# Patient Record
Sex: Female | Born: 1940 | Race: White | Hispanic: No | Marital: Married | Smoking: Never smoker
Health system: Southern US, Community
[De-identification: ages and names within clinical notes are randomized; demographics above are authoritative.]

## PROBLEM LIST (undated history)

## (undated) DIAGNOSIS — J45909 Unspecified asthma, uncomplicated: Secondary | ICD-10-CM

## (undated) DIAGNOSIS — Z889 Allergy status to unspecified drugs, medicaments and biological substances status: Secondary | ICD-10-CM

## (undated) DIAGNOSIS — C801 Malignant (primary) neoplasm, unspecified: Secondary | ICD-10-CM

## (undated) DIAGNOSIS — M1990 Unspecified osteoarthritis, unspecified site: Secondary | ICD-10-CM

## (undated) DIAGNOSIS — Z923 Personal history of irradiation: Secondary | ICD-10-CM

## (undated) DIAGNOSIS — K219 Gastro-esophageal reflux disease without esophagitis: Secondary | ICD-10-CM

## (undated) DIAGNOSIS — K2970 Gastritis, unspecified, without bleeding: Secondary | ICD-10-CM

## (undated) DIAGNOSIS — I499 Cardiac arrhythmia, unspecified: Secondary | ICD-10-CM

## (undated) DIAGNOSIS — E785 Hyperlipidemia, unspecified: Secondary | ICD-10-CM

## (undated) HISTORY — PX: EYE SURGERY: SHX253

## (undated) HISTORY — PX: NISSEN FUNDOPLICATION: SHX2091

## (undated) HISTORY — PX: HAND ARTHROPLASTY: SHX968

## (undated) HISTORY — PX: ABDOMINAL HYSTERECTOMY: SHX81

## (undated) HISTORY — PX: DIAGNOSTIC LAPAROSCOPY: SUR761

## (undated) HISTORY — DX: Hyperlipidemia, unspecified: E78.5

## (undated) HISTORY — PX: TONSILLECTOMY: SUR1361

## (undated) HISTORY — PX: KNEE ARTHROSCOPY: SHX127

## (undated) HISTORY — PX: CHOLECYSTECTOMY: SHX55

## (undated) HISTORY — PX: DG TOES RT FOOT MULTIPLE SPECIF (ARMC HX): HXRAD1007

---

## 2014-06-01 ENCOUNTER — Other Ambulatory Visit: Payer: Self-pay | Admitting: Orthopedic Surgery

## 2014-07-07 ENCOUNTER — Encounter (HOSPITAL_BASED_OUTPATIENT_CLINIC_OR_DEPARTMENT_OTHER): Payer: Self-pay | Admitting: *Deleted

## 2014-07-09 ENCOUNTER — Encounter (HOSPITAL_BASED_OUTPATIENT_CLINIC_OR_DEPARTMENT_OTHER): Payer: Self-pay | Admitting: Orthopedic Surgery

## 2014-07-09 ENCOUNTER — Ambulatory Visit (HOSPITAL_BASED_OUTPATIENT_CLINIC_OR_DEPARTMENT_OTHER)
Admission: RE | Admit: 2014-07-09 | Discharge: 2014-07-09 | Disposition: A | Discharge status: Home / Self Care | Payer: Medicare Other | Source: Ambulatory Visit | Attending: Orthopedic Surgery | Admitting: Orthopedic Surgery

## 2014-07-09 ENCOUNTER — Ambulatory Visit (HOSPITAL_BASED_OUTPATIENT_CLINIC_OR_DEPARTMENT_OTHER): Payer: Medicare Other | Admitting: Certified Registered"

## 2014-07-09 ENCOUNTER — Encounter (HOSPITAL_BASED_OUTPATIENT_CLINIC_OR_DEPARTMENT_OTHER)
Admission: RE | Disposition: A | Discharge status: Home / Self Care | Payer: Self-pay | Source: Ambulatory Visit | Attending: Orthopedic Surgery

## 2014-07-09 DIAGNOSIS — G5602 Carpal tunnel syndrome, left upper limb: Secondary | ICD-10-CM | POA: Insufficient documentation

## 2014-07-09 DIAGNOSIS — J45909 Unspecified asthma, uncomplicated: Secondary | ICD-10-CM | POA: Diagnosis not present

## 2014-07-09 DIAGNOSIS — M158 Other polyosteoarthritis: Secondary | ICD-10-CM | POA: Insufficient documentation

## 2014-07-09 DIAGNOSIS — K219 Gastro-esophageal reflux disease without esophagitis: Secondary | ICD-10-CM | POA: Diagnosis not present

## 2014-07-09 DIAGNOSIS — R200 Anesthesia of skin: Secondary | ICD-10-CM | POA: Diagnosis present

## 2014-07-09 DIAGNOSIS — Z79899 Other long term (current) drug therapy: Secondary | ICD-10-CM | POA: Insufficient documentation

## 2014-07-09 DIAGNOSIS — Z96691 Finger-joint replacement of right hand: Secondary | ICD-10-CM | POA: Diagnosis not present

## 2014-07-09 DIAGNOSIS — Z791 Long term (current) use of non-steroidal anti-inflammatories (NSAID): Secondary | ICD-10-CM | POA: Diagnosis not present

## 2014-07-09 DIAGNOSIS — M47892 Other spondylosis, cervical region: Secondary | ICD-10-CM | POA: Insufficient documentation

## 2014-07-09 HISTORY — DX: Unspecified asthma, uncomplicated: J45.909

## 2014-07-09 HISTORY — DX: Allergy status to unspecified drugs, medicaments and biological substances: Z88.9

## 2014-07-09 HISTORY — DX: Gastro-esophageal reflux disease without esophagitis: K21.9

## 2014-07-09 HISTORY — PX: CARPAL TUNNEL RELEASE: SHX101

## 2014-07-09 HISTORY — DX: Unspecified osteoarthritis, unspecified site: M19.90

## 2014-07-09 HISTORY — DX: Gastritis, unspecified, without bleeding: K29.70

## 2014-07-09 LAB — POCT HEMOGLOBIN-HEMACUE: HEMOGLOBIN: 13.5 g/dL (ref 12.0–15.0)

## 2014-07-09 SURGERY — CARPAL TUNNEL RELEASE
Anesthesia: Monitor Anesthesia Care | Site: Hand | Laterality: Left

## 2014-07-09 MED ORDER — ONDANSETRON HCL 4 MG/2ML IJ SOLN
INTRAMUSCULAR | Status: DC | PRN
  Administered 2014-07-09: 4 mg via INTRAVENOUS

## 2014-07-09 MED ORDER — LACTATED RINGERS IV SOLN
INTRAVENOUS | Status: DC
  Administered 2014-07-09: 08:00:00 via INTRAVENOUS

## 2014-07-09 MED ORDER — PROPOFOL 10 MG/ML IV BOLUS
INTRAVENOUS | Status: DC | PRN
  Administered 2014-07-09 (×3): 20 mg via INTRAVENOUS

## 2014-07-09 MED ORDER — GLYCOPYRROLATE 0.2 MG/ML IJ SOLN: 0.2000 mg | Freq: Once | INTRAMUSCULAR | Status: DC | PRN

## 2014-07-09 MED ORDER — LIDOCAINE HCL (PF) 0.5 % IJ SOLN
INTRAMUSCULAR | Status: DC | PRN
  Administered 2014-07-09: 25 mL via INTRAVENOUS

## 2014-07-09 MED ORDER — LIDOCAINE HCL (CARDIAC) 20 MG/ML IV SOLN
INTRAVENOUS | Status: DC | PRN
  Administered 2014-07-09: 10 mg via INTRAVENOUS

## 2014-07-09 MED ORDER — VANCOMYCIN HCL IN DEXTROSE 1-5 GM/200ML-% IV SOLN
INTRAVENOUS | Status: AC
  Filled 2014-07-09: qty 200

## 2014-07-09 MED ORDER — BUPIVACAINE HCL (PF) 0.25 % IJ SOLN
INTRAMUSCULAR | Status: DC | PRN
  Administered 2014-07-09: 4 mL

## 2014-07-09 MED ORDER — MIDAZOLAM HCL 2 MG/2ML IJ SOLN
INTRAMUSCULAR | Status: AC
  Filled 2014-07-09: qty 2

## 2014-07-09 MED ORDER — FENTANYL CITRATE (PF) 100 MCG/2ML IJ SOLN: 25.0000 ug | INTRAMUSCULAR | Status: DC | PRN

## 2014-07-09 MED ORDER — VANCOMYCIN HCL IN DEXTROSE 1-5 GM/200ML-% IV SOLN
1000.0000 mg | INTRAVENOUS | Status: AC
  Administered 2014-07-09: 1000 mg via INTRAVENOUS

## 2014-07-09 MED ORDER — FENTANYL CITRATE (PF) 100 MCG/2ML IJ SOLN: 50.0000 ug | INTRAMUSCULAR | Status: DC | PRN

## 2014-07-09 MED ORDER — MEPERIDINE HCL 25 MG/ML IJ SOLN: 6.2500 mg | INTRAMUSCULAR | Status: DC | PRN

## 2014-07-09 MED ORDER — FENTANYL CITRATE (PF) 100 MCG/2ML IJ SOLN
INTRAMUSCULAR | Status: DC | PRN
  Administered 2014-07-09: 25 ug via INTRAVENOUS
  Administered 2014-07-09: 50 ug via INTRAVENOUS

## 2014-07-09 MED ORDER — BUPIVACAINE HCL (PF) 0.25 % IJ SOLN
INTRAMUSCULAR | Status: AC
  Filled 2014-07-09: qty 30

## 2014-07-09 MED ORDER — CHLORHEXIDINE GLUCONATE 4 % EX LIQD: 60.0000 mL | Freq: Once | CUTANEOUS | Status: DC

## 2014-07-09 MED ORDER — FENTANYL CITRATE (PF) 100 MCG/2ML IJ SOLN
INTRAMUSCULAR | Status: AC
  Filled 2014-07-09: qty 4

## 2014-07-09 MED ORDER — MIDAZOLAM HCL 2 MG/2ML IJ SOLN
1.0000 mg | INTRAMUSCULAR | Status: DC | PRN
Start: 2014-07-09 — End: 2014-07-09
  Administered 2014-07-09: 1 mg via INTRAVENOUS

## 2014-07-09 MED ORDER — HYDROCODONE-ACETAMINOPHEN 5-325 MG PO TABS: 1.0000 | ORAL_TABLET | Freq: Four times a day (QID) | ORAL | Status: DC | PRN

## 2014-07-09 SURGICAL SUPPLY — 37 items
BLADE SURG 15 STRL LF DISP TIS (BLADE) ×1 IMPLANT
BLADE SURG 15 STRL SS (BLADE) ×1
BNDG COHESIVE 3X5 TAN STRL LF (GAUZE/BANDAGES/DRESSINGS) ×2 IMPLANT
BNDG ESMARK 4X9 LF (GAUZE/BANDAGES/DRESSINGS) IMPLANT
BNDG GAUZE ELAST 4 BULKY (GAUZE/BANDAGES/DRESSINGS) ×2 IMPLANT
CHLORAPREP W/TINT 26ML (MISCELLANEOUS) ×2 IMPLANT
CORDS BIPOLAR (ELECTRODE) ×2 IMPLANT
COVER BACK TABLE 60X90IN (DRAPES) ×2 IMPLANT
COVER MAYO STAND STRL (DRAPES) ×2 IMPLANT
CUFF TOURNIQUET SINGLE 18IN (TOURNIQUET CUFF) ×2 IMPLANT
DRAPE EXTREMITY T 121X128X90 (DRAPE) ×2 IMPLANT
DRAPE SURG 17X23 STRL (DRAPES) ×2 IMPLANT
DRSG PAD ABDOMINAL 8X10 ST (GAUZE/BANDAGES/DRESSINGS) ×2 IMPLANT
GAUZE SPONGE 4X4 12PLY STRL (GAUZE/BANDAGES/DRESSINGS) ×2 IMPLANT
GAUZE XEROFORM 1X8 LF (GAUZE/BANDAGES/DRESSINGS) ×2 IMPLANT
GLOVE BIO SURGEON STRL SZ 6.5 (GLOVE) ×2 IMPLANT
GLOVE BIOGEL PI IND STRL 7.0 (GLOVE) ×1 IMPLANT
GLOVE BIOGEL PI IND STRL 7.5 (GLOVE) ×1 IMPLANT
GLOVE BIOGEL PI IND STRL 8.5 (GLOVE) ×1 IMPLANT
GLOVE BIOGEL PI INDICATOR 7.0 (GLOVE) ×1
GLOVE BIOGEL PI INDICATOR 7.5 (GLOVE) ×1
GLOVE BIOGEL PI INDICATOR 8.5 (GLOVE) ×1
GLOVE SURG ORTHO 8.0 STRL STRW (GLOVE) ×2 IMPLANT
GLOVE SURG SS PI 7.5 STRL IVOR (GLOVE) ×2 IMPLANT
GOWN STRL REUS W/ TWL LRG LVL3 (GOWN DISPOSABLE) ×1 IMPLANT
GOWN STRL REUS W/TWL LRG LVL3 (GOWN DISPOSABLE) ×1
GOWN STRL REUS W/TWL XL LVL3 (GOWN DISPOSABLE) ×4 IMPLANT
NEEDLE PRECISIONGLIDE 27X1.5 (NEEDLE) IMPLANT
NS IRRIG 1000ML POUR BTL (IV SOLUTION) ×2 IMPLANT
PACK BASIN DAY SURGERY FS (CUSTOM PROCEDURE TRAY) ×2 IMPLANT
STOCKINETTE 4X48 STRL (DRAPES) ×2 IMPLANT
SUT ETHILON 4 0 PS 2 18 (SUTURE) ×2 IMPLANT
SUT VICRYL 4-0 PS2 18IN ABS (SUTURE) IMPLANT
SYR BULB 3OZ (MISCELLANEOUS) ×2 IMPLANT
SYR CONTROL 10ML LL (SYRINGE) IMPLANT
TOWEL OR 17X24 6PK STRL BLUE (TOWEL DISPOSABLE) ×2 IMPLANT
UNDERPAD 30X30 (UNDERPADS AND DIAPERS) ×2 IMPLANT

## 2014-07-09 NOTE — Discharge Instructions (Addendum)
[facility] Instructions °Hand Surgery ° °Wound Care: °Keep your hand elevated above the level of your heart.  Do not allow it to dangle by your side.  Keep the dressing dry and do not remove it unless your doctor advises you to do so.  He will usually change it at the time of your post-op visit.  Moving your fingers is advised to stimulate circulation but will depend on the site of your surgery.  If you have a splint applied, your doctor will advise you regarding movement. ° °Activity: °Do not drive or operate machinery today.  Rest today and then you may return to your normal activity and work as indicated by your physician. ° °Diet:  °Drink liquids today or eat a light diet.  You may resume a regular diet tomorrow.   ° °General expectations: °Pain for two to three days. °Fingers may become slightly swollen. ° °Call your doctor if any of the following occur: °Severe pain not relieved by pain medication. °Elevated temperature. °Dressing soaked with blood. °Inability to move fingers. °White or bluish color to fingers. ° ° °Post Anesthesia Home Care Instructions ° °Activity: °Get plenty of rest for the remainder of the day. A responsible adult should stay with you for 24 hours following the procedure.  °For the next 24 hours, DO NOT: °-Drive a car °-Operate machinery °-Drink alcoholic beverages °-Take any medication unless instructed by your physician °-Make any legal decisions or sign important papers. ° °Meals: °Start with liquid foods such as gelatin or soup. Progress to regular foods as tolerated. Avoid greasy, spicy, heavy foods. If nausea and/or vomiting occur, drink only clear liquids until the nausea and/or vomiting subsides. Call your physician if vomiting continues. ° °Special Instructions/Symptoms: °Your throat may feel dry or sore from the anesthesia or the breathing tube placed in your throat during surgery. If this causes discomfort, gargle with warm salt water. The discomfort should disappear within 24  hours. ° °If you had a scopolamine patch placed behind your ear for the management of post- operative nausea and/or vomiting: ° °1. The medication in the patch is effective for 72 hours, after which it should be removed.  Wrap patch in a tissue and discard in the trash. Wash hands thoroughly with soap and water. °2. You may remove the patch earlier than 72 hours if you experience unpleasant side effects which may include dry mouth, dizziness or visual disturbances. °3. Avoid touching the patch. Wash your hands with soap and water after contact with the patch. °  °Call your surgeon if you experience:  ° °1.  Fever over 101.0. °2.  Inability to urinate. °3.  Nausea and/or vomiting. °4.  Extreme swelling or bruising at the surgical site. °5.  Continued bleeding from the incision. °6.  Increased pain, redness or drainage from the incision. °7.  Problems related to your pain medication. °8. Any change in color, movement and/or sensation °9. Any problems and/or concerns ° ° °

## 2014-07-09 NOTE — H&P (Signed)
Rebekah Wells is a 74 year old right hand dominant female. She is Dr. Gavin Pound' mother. She is complaining of her left thumb being numb for the past 6 months. She has had a trigger finger injection to the left thumb back in April. She is visiting from [place], [place]. She does have a history of whiplash injury. She has no history of injury to her hand. She complains of relatively constant numbness and tingling, moderate throbbing pain. She states it is gradually getting worse. She has been wearing a brace. She is occasionally awakened at night. She has a history of arthritis. There is no history of diabetes, thyroid problems, or gout. She has a history of stomach ulcer. She is presently taking Meloxicam. She has had nerve conductions done in October revealing a carpal tunnel syndrome on her left side. I had these to review but unfortunately I am dictating this later and they have taken the nerve conductions with them. The right side was not tested. She has a prior history of joint replacement at the PIP joints right hand.  PAST MEDICAL HISTORY: She is allergic to PCN and sulfa. She is on Plaquenil, Simvastatin, Mobic, Prilosec, Fosamax, and vitamins. She relates no other surgery.  FAMILY MEDICAL HISTORY: Positive for heart disease, high BP and arthritis.  SOCIAL HISTORY:  She does not smoke or use alcohol. She is married and retired.  REVIEW OF SYSTEMS: Positive for cataracts, asthma, stomach ulcer, and anemia, otherwise negative 14 points.  Rebekah Wells is an 74 y.o. female.   Chief Complaint: CTS left HPI:  See above  Past Medical History  Diagnosis Date  . Asthma   . GERD (gastroesophageal reflux disease)   . H/O seasonal allergies   . Gastritis   . Arthritis     OA    Past Surgical History  Procedure Laterality Date  . Diagnostic laparoscopy    . Cholecystectomy    . Tonsillectomy    . Abdominal hysterectomy    . Nissen fundoplication    . Knee arthroscopy      x3  . Hand  arthroplasty Right     for OA  . Dg toes rt foot multiple specif ([facility] hx) Right     History reviewed. No pertinent family history. Social History:  reports that she has never smoked. She does not have any smokeless tobacco history on file. She reports that she does not drink alcohol or use illicit drugs.  Allergies:  Allergies  Allergen Reactions  . Penicillins Rash  . Sulfa Antibiotics Rash    No prescriptions prior to admission    No results found for this or any previous visit (from the past 48 hour(s)).  No results found.   Pertinent items are noted in HPI.  Height 5\' 3"  (1.6 m), weight 63.504 kg (140 lb).  General appearance: alert, cooperative and appears stated age Head: Normocephalic, without obvious abnormality Neck: no adenopathy, no carotid bruit, no JVD, supple, symmetrical, trachea midline and thyroid not enlarged, symmetric, no tenderness/mass/nodules Resp: clear to auscultation bilaterally Cardio: regular rate and rhythm, S1, S2 normal, no murmur, click, rub or gallop GI: soft, non-tender; bowel sounds normal; no masses,  no organomegaly Extremities: numbness left hand Pulses: 2+ and symmetric Skin: Skin color, texture, turgor normal. 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 It is noted she does have angulatory deformities of the PIP joints of the index and middle on her left hand.  DIAGNOSIS: (1) Generalized osteoarthritis with cervical spondylosis. (2) Carpal tunnel syndrome left hand.  PLAN: She would like to proceed to have the left carpal tunnel release performed. Pre, peri and post op care are discussed along with risks and complications. Patient is aware there is no  guarantee with surgery, possibility of infection, injury to arteries, nerves, and tendons, incomplete relief and dystrophy. Questions are encouraged and answered to the patient's satisfaction.   Dilyn Smiles R 07/09/2014, 6:36 AM

## 2014-07-09 NOTE — Anesthesia Postprocedure Evaluation (Signed)
  Anesthesia Post-op Note  Patient: Rebekah Wells  Procedure(s) Performed: Procedure(s): LEFT CARPAL TUNNEL RELEASE (Left)  Patient Location: PACU  Anesthesia Type: MAC, Bier Block   Level of Consciousness: awake, alert  and oriented  Airway and Oxygen Therapy: Patient Spontanous Breathing  Post-op Pain: none  Post-op Assessment: Post-op Vital signs reviewed  Post-op Vital Signs: Reviewed  Last Vitals:  Filed Vitals:   07/09/14 1015  BP: 133/62  Pulse: 82  Temp: 36.8 C  Resp: 16    Complications: No apparent anesthesia complications

## 2014-07-09 NOTE — Anesthesia Procedure Notes (Signed)
Procedure Name: MAC Date/Time: 07/09/2014 8:48 AM Performed by: Baxter Flattery Pre-anesthesia Checklist: Patient identified, Emergency Drugs available, Suction available, Patient being monitored and Timeout performed Patient Re-evaluated:Patient Re-evaluated prior to inductionOxygen Delivery Method: Simple face mask Preoxygenation: Pre-oxygenation with 100% oxygen Intubation Type: IV induction

## 2014-07-09 NOTE — Brief Op Note (Signed)
07/09/2014  9:23 AM  PATIENT:  Rebekah Wells  74 y.o. female  PRE-OPERATIVE DIAGNOSIS:  left carpal tunnel syndrome G56.02  POST-OPERATIVE DIAGNOSIS:  left carpal tunnel syndrome G56.02  PROCEDURE:  Procedure(s): LEFT CARPAL TUNNEL RELEASE (Left)  SURGEON:  Surgeon(s) and Role:    * Daryll Brod, MD - Primary  PHYSICIAN ASSISTANT:   ASSISTANTS: none   ANESTHESIA:   local and regional  EBL:  Total I/O In: 500 [I.V.:500] Out: -   BLOOD ADMINISTERED:none  DRAINS: none   LOCAL MEDICATIONS USED:  BUPIVICAINE   SPECIMEN:  No Specimen  DISPOSITION OF SPECIMEN:  N/A  COUNTS:  YES  TOURNIQUET:   Total Tourniquet Time Documented: Forearm (Left) - 20 minutes Total: Forearm (Left) - 20 minutes   DICTATION: .Other Dictation: Dictation Number [id number]  PLAN OF CARE: Discharge to home after PACU  PATIENT DISPOSITION:  PACU - hemodynamically stable.

## 2014-07-09 NOTE — Anesthesia Preprocedure Evaluation (Signed)
Anesthesia Evaluation  Patient identified by MRN, date of birth, ID band Patient awake    Reviewed: Allergy & Precautions, NPO status , Patient's Chart, lab work & pertinent test results  Airway Mallampati: I  TM Distance: >3 FB Neck ROM: Full    Dental  (+) Upper Dentures, Lower Dentures, Dental Advisory Given   Pulmonary asthma ,  breath sounds clear to auscultation        Cardiovascular Rhythm:Regular Rate:Normal     Neuro/Psych    GI/Hepatic GERD-  Medicated and Controlled,  Endo/Other    Renal/GU      Musculoskeletal   Abdominal   Peds  Hematology   Anesthesia Other Findings   Reproductive/Obstetrics                             Anesthesia Physical Anesthesia Plan  ASA: II  Anesthesia Plan: MAC and Bier Block   Post-op Pain Management:    Induction: Intravenous  Airway Management Planned: Simple Face Mask  Additional Equipment:   Intra-op Plan:   Post-operative Plan: Extubation in OR  Informed Consent: I have reviewed the patients History and Physical, chart, labs and discussed the procedure including the risks, benefits and alternatives for the proposed anesthesia with the patient or authorized representative who has indicated his/her understanding and acceptance.   Dental advisory given  Plan Discussed with: CRNA, Anesthesiologist and Surgeon  Anesthesia Plan Comments:         Anesthesia Quick Evaluation

## 2014-07-09 NOTE — Transfer of Care (Signed)
Immediate Anesthesia Transfer of Care Note  Patient: Rebekah Wells  Procedure(s) Performed: Procedure(s): LEFT CARPAL TUNNEL RELEASE (Left)  Patient Location: PACU  Anesthesia Type:MAC and Bier block  Level of Consciousness: awake, alert  and oriented  Airway & Oxygen Therapy: Patient Spontanous Breathing and Patient connected to face mask oxygen  Post-op Assessment: Report given to RN, Post -op Vital signs reviewed and stable and Patient moving all extremities  Post vital signs: Reviewed and stable  Last Vitals:  Filed Vitals:   07/09/14 0747  BP: 126/75  Pulse: 90  Temp: 36.7 C  Resp: 18    Complications: No apparent anesthesia complications

## 2014-07-09 NOTE — Op Note (Signed)
Dictation Number [id number]

## 2014-07-10 ENCOUNTER — Encounter (HOSPITAL_BASED_OUTPATIENT_CLINIC_OR_DEPARTMENT_OTHER): Payer: Self-pay | Admitting: Orthopedic Surgery

## 2014-07-10 NOTE — Op Note (Signed)
NAMEBRODI, NERY                 ACCOUNT NO.:  [id number]  MEDICAL RECORD NO.:  [id number]  LOCATION:                                FACILITY:  [facility]  PHYSICIAN:  Daryll Brod, M.D.       DATE OF BIRTH:  09-04-1940  DATE OF PROCEDURE:  07/09/2014 DATE OF DISCHARGE:  07/09/2014                              OPERATIVE REPORT   PREOPERATIVE DIAGNOSIS:  Carpal tunnel syndrome, left hand.  POSTOPERATIVE DIAGNOSIS:  Carpal tunnel syndrome, left hand.  OPERATION:  Decompression left median nerve.  SURGEON:  Daryll Brod, MD  ASSISTANT:  None.  ANESTHESIA:  Forearm-based IV regional with local infiltration.  ANESTHESIOLOGIST:  Lorrene Reid, MD  HISTORY:  The patient is a 74 year old female with a history of arthritis, carpal tunnel syndrome.  Nerve conduction is positive.  This has not responded to conservative treatment, and she has elected to undergo surgical release of the median nerve of her left wrist.  Pre, peri, and postoperative course have been discussed along with risks and complications.  She is aware that there is no guarantee with the surgery, possibility of infection, recurrence of injury to arteries, nerves, tendons, incomplete relief of symptoms, and dystrophy.  In the preoperative area, the patient is seen, the extremity marked by both patient and surgeon.  Antibiotic given.  PROCEDURE IN DETAIL:  The patient was brought to the operating room, where forearm-based IV regional anesthetic was carried out without difficulty.  She was prepped using ChloraPrep, supine position, left arm free.  A 3-minute dry time was allowed.  Time-out taken, confirming the patient and procedure.  A longitudinal incision was made in the left palm, carried down through subcutaneous tissue.  She had some feeling. A local infiltration with 0.25% bupivacaine without epinephrine was given, approximately 5 mL was used.  The dissection was carried down to the palmar fascia which was split.   Bleeders were electrocauterized with bipolar.  The flexor tendon to the ring and little finger was identified after identification of the superficial palmar arch.  Retractors were placed retracting the median nerve to the thenar eminence and the ulnar nerve ulnarly.  The carpal retinaculum was then incised with sharp dissection.  A right angle and Sewall retractor were placed between skin and forearm fascia.  The fascia was released for approximately a centimeter and a half to 2 cm under direct vision.  The canal was explored.  Air compression to the nerve was apparent.  Motor branch entered in the muscle.  No further lesions were identified.  The wound was copiously irrigated with saline.  The nerve then bathed with bupivacaine, and the wound closed with interrupted 4-0 nylon sutures.  A sterile compressive dressing with the fingers free was applied.  On deflation of the tourniquet, all fingers immediately pinked.  She was taken to the recovery room for observation in satisfactory condition.  She will be discharged home to return to the [facility] in 1 week on [place].          ______________________________ Daryll Brod, M.D.     GK/MEDQ  D:  07/09/2014  T:  07/10/2014  Job:  [id number]

## 2016-06-08 ENCOUNTER — Encounter: Payer: Self-pay | Admitting: Cardiology

## 2016-06-13 ENCOUNTER — Telehealth: Payer: Self-pay | Admitting: Cardiology

## 2016-06-13 NOTE — Telephone Encounter (Signed)
Received records from [facility] for appointment on 06/28/16 with Dr Percival Spanish.  Records put with Dr Hochrein's schedule for 06/28/16. lp

## 2016-06-27 NOTE — Progress Notes (Signed)
Cardiology Office Note   Date:  06/29/2016   ID:  Rebekah Wells, Rebekah Wells Oct 18, 1940, MRN [id number]  PCP:  Rebekah Cobb, MD  Cardiologist:   Rebekah Breeding, MD  Referring:  Rebekah Cobb, MD  Chief Complaint  Patient presents with  . Tachycardia      History of Present Illness: Rebekah Wells is a 76 y.o. female who presents for referral by Dr.  Al Corpus for preop evaluation and for evaluation of tachycardia.  Rebekah Wells has no past cardiac history but was noted several months ago to have a rapid heart rate. Rebekah Wells knows Rebekah Wells was tired walking around her neighborhood. Rebekah Wells would get a little more fatigued than Rebekah Wells thought Rebekah Wells should. Rebekah Wells was noted to have some rapid heart rate but apparently this is all in sinus tachycardia. Rebekah Wells was treated with low dose of beta blocker. Rebekah Wells says Rebekah Wells feels okay now. Rebekah Wells's not noticing her heart racing. Rebekah Wells's not having any presyncope or syncope. Rebekah Wells's actually doing some [employer] exercises with chair aerobics. Rebekah Wells feels better with this. Rebekah Wells's not having any PND or orthopnea. Rebekah Wells denies any chest pressure, neck or arm discomfort. Rebekah Wells does have some joint problems and is going to have a knee replaced.     Past Medical History:  Diagnosis Date  . Arthritis    OA  . Asthma   . Gastritis   . GERD (gastroesophageal reflux disease)   . H/O seasonal allergies   . Hyperlipemia     Past Surgical History:  Procedure Laterality Date  . ABDOMINAL HYSTERECTOMY    . CARPAL TUNNEL RELEASE Left 07/09/2014   Procedure: LEFT CARPAL TUNNEL RELEASE;  Surgeon: Rebekah Brod, MD;  Location: [facility];  Service: Orthopedics;  Laterality: Left;  . CHOLECYSTECTOMY    . DG TOES RT FOOT MULTIPLE SPECIF ([place] HX) Right   . DIAGNOSTIC LAPAROSCOPY    . HAND ARTHROPLASTY Right    for OA  . KNEE ARTHROSCOPY     x3  . NISSEN FUNDOPLICATION    . TONSILLECTOMY       Current Outpatient Prescriptions  Medication Sig Dispense Refill  . albuterol (PROVENTIL HFA;VENTOLIN HFA)  108 (90 BASE) MCG/ACT inhaler Inhale into the lungs every 6 (six) hours as needed for wheezing or shortness of breath.    . calcium carbonate (OS-CAL) 600 MG TABS tablet Take 600 mg by mouth 2 (two) times daily with a meal.    . cetirizine (ZYRTEC) 10 MG tablet Take 10 mg by mouth daily.    . Fluticasone-Salmeterol (ADVAIR) 250-50 MCG/DOSE AEPB Inhale 1 puff into the lungs 2 (two) times daily.    . Lutein-Zeaxanthin (OCUVITE LUTEIN 25) 25-5 MG CAPS Take 1 capsule by mouth daily.    . meloxicam (MOBIC) 15 MG tablet Take 15 mg by mouth daily.    . metoprolol succinate (TOPROL-XL) 25 MG 24 hr tablet Take 25 mg by mouth daily.    Marland Kitchen omeprazole (PRILOSEC) 20 MG capsule Take 20 mg by mouth daily.    . simvastatin (ZOCOR) 20 MG tablet Take 20 mg by mouth daily.     No current facility-administered medications for this visit.     Allergies:   Penicillins and Sulfa antibiotics    Social History:  The patient  reports that Rebekah Wells has never smoked. Rebekah Wells has never used smokeless tobacco. Rebekah Wells reports that Rebekah Wells does not drink alcohol or use drugs.   Family History:  The patient's family history includes CAD (age of onset: 40)  in her father; Colon cancer in her mother; Lung cancer in her sister.    ROS:  Please see the history of present illness.   Otherwise, review of systems are positive for none.   All other systems are reviewed and negative.    PHYSICAL EXAM: VS:  BP 96/60 (BP Location: Left Arm, Patient Position: Sitting, Cuff Size: Normal)   Pulse 94   Ht 5\' 3"  (1.6 m)   Wt 147 lb (66.7 kg)   BMI 26.04 kg/m  , BMI Body mass index is 26.04 kg/m. GENERAL:  Well appearing HEENT:  Pupils equal round and reactive, fundi not visualized, oral mucosa unremarkable NECK:  No jugular venous distention, waveform within normal limits, carotid upstroke brisk and symmetric, no bruits, no thyromegaly LYMPHATICS:  No cervical, inguinal adenopathy LUNGS:  Clear to auscultation bilaterally BACK:  No CVA  tenderness CHEST:  Unremarkable HEART:  PMI not displaced or sustained,S1 and S2 within normal limits, no S3, no S4, no clicks, no rubs, no murmurs ABD:  Flat, positive bowel sounds normal in frequency in pitch, no bruits, no rebound, no guarding, no midline pulsatile mass, no hepatomegaly, no splenomegaly EXT:  2 plus pulses throughout, no edema, no cyanosis no clubbing SKIN:  No rashes no nodules NEURO:  Cranial nerves II through XII grossly intact, motor grossly intact throughout PSYCH:  Cognitively intact, oriented to person place and time    EKG:  EKG is ordered today. The ekg ordered today demonstrates sinus rhythm, rate 94, left axis deviation, low voltage on the limb leads, poor anterior R wave progression, no acute ST-T wave changes.   Recent Labs: No results found for requested labs within last 8760 hours.    Lipid Panel No results found for: CHOL, TRIG, HDL, CHOLHDL, VLDL, LDLCALC, LDLDIRECT    Wt Readings from Last 3 Encounters:  06/28/16 147 lb (66.7 kg)  07/09/14 146 lb (66.2 kg)      Other studies Reviewed: Additional studies/ records that were reviewed today include: Office records. Review of the above records demonstrates:  Please see elsewhere in the note.     ASSESSMENT AND PLAN:  TACHYCARDIA:  Rebekah Wells reports a recent TSH was normal stride of obtaining these results. Her chemistry was normal. Rebekah Wells did have a Holter like to review this. If Rebekah Wells has normal nighttime all give her heart rate I doubt that I'll do any further management. Rebekah Wells continues low-dose beta blocker. I do not suspect an automatic or ectopic dysrhythmia. As Rebekah Wells gets her knee surgery and is able to become more active routinely we should see her heart rate starts to come down.  PREOP:  The patient has no symptoms consistent with obstructive coronary disease. Rebekah Wells has no significant risk factors. Rebekah Wells has a high functional level. Therefore, based on ACC/AHA guidelines, the patient would be at acceptable  risk for the planned procedure without further cardiovascular testing.   Current medicines are reviewed at length with the patient today.  The patient does not have concerns regarding medicines.  The following changes have been made:  no change  Labs/ tests ordered today include: None  Orders Placed This Encounter  Procedures  . EKG 12-Lead     Disposition:   FU with me as needed    Signed, Rebekah Breeding, MD  06/29/2016 12:58 PM    [place]

## 2016-06-28 ENCOUNTER — Ambulatory Visit (INDEPENDENT_AMBULATORY_CARE_PROVIDER_SITE_OTHER): Payer: Medicare Other | Admitting: Cardiology

## 2016-06-28 ENCOUNTER — Encounter: Payer: Self-pay | Admitting: Cardiology

## 2016-06-28 VITALS — BP 96/60 | HR 94 | Ht 63.0 in | Wt 147.0 lb

## 2016-06-28 DIAGNOSIS — Z01818 Encounter for other preprocedural examination: Secondary | ICD-10-CM

## 2016-06-28 DIAGNOSIS — R000 Tachycardia, unspecified: Secondary | ICD-10-CM | POA: Diagnosis not present

## 2016-06-28 NOTE — Patient Instructions (Signed)

## 2016-06-29 ENCOUNTER — Encounter: Payer: Self-pay | Admitting: Cardiology

## 2016-11-15 NOTE — Patient Instructions (Addendum)
Rebekah Wells  11/15/2016   Your procedure is scheduled on: Tuesday 12/05/2016  Report to [facility] Main  Entrance  Follow signs to Short Stay on first floor at  0515  AM   Call this number if you have problems the morning of surgery  [phone]   Remember: ONLY 1 PERSON MAY GO WITH YOU TO SHORT STAY TO GET  READY MORNING OF [place].   Do not eat food or drink liquids :After Midnight.     Take these medicines the morning of surgery with A SIP OF WATER: Metoprolol, Omeprazole, use Advair inhaler and Albuterol inhaler if needed and bring them with you to the hospital                                 You may not have any metal on your body including hair pins and              piercings  Do not wear jewelry, make-up, lotions, powders or perfumes, deodorant             Do not wear nail polish.  Do not shave  48 hours prior to surgery.              Men may shave face and neck.   Do not bring valuables to the hospital. [place].  Contacts, dentures or bridgework may not be worn into surgery.  Leave suitcase in the car. After surgery it may be brought to your room.     Patients discharged the day of surgery will not be allowed to drive home.    Special Instructions: N/A              Please read over the following fact sheets you were given: _____________________________________________________________________             [facility] - Preparing for Surgery Before surgery, you can play an important role.  Because skin is not sterile, your skin needs to be as free of germs as possible.  You can reduce the number of germs on your skin by washing with CHG (chlorahexidine gluconate) soap before surgery.  CHG is an antiseptic cleaner which kills germs and bonds with the skin to continue killing germs even after washing. Please DO NOT use if you have an allergy to CHG or antibacterial soaps.  If your skin  becomes reddened/irritated stop using the CHG and inform your nurse when you arrive at Short Stay. Do not shave (including legs and underarms) for at least 48 hours prior to the first CHG shower.  You may shave your face/neck. Please follow these instructions carefully:  1.  Shower with CHG Soap the night before surgery and the  morning of Surgery.  2.  If you choose to wash your hair, wash your hair first as usual with your  normal  shampoo.  3.  After you shampoo, rinse your hair and body thoroughly to remove the  shampoo.                           4.  Use CHG as you would any other liquid soap.  You can apply chg directly  to  the skin and wash                       Gently with a scrungie or clean washcloth.  5.  Apply the CHG Soap to your body ONLY FROM THE NECK DOWN.   Do not use on face/ open                           Wound or open sores. Avoid contact with eyes, ears mouth and genitals (private parts).                       Wash face,  Genitals (private parts) with your normal soap.             6.  Wash thoroughly, paying special attention to the area where your surgery  will be performed.  7.  Thoroughly rinse your body with warm water from the neck down.  8.  DO NOT shower/wash with your normal soap after using and rinsing off  the CHG Soap.                9.  Pat yourself dry with a clean towel.            10.  Wear clean pajamas.            11.  Place clean sheets on your bed the night of your first shower and do not  sleep with pets. Day of Surgery : Do not apply any lotions/deodorants the morning of surgery.  Please wear clean clothes to the hospital/surgery center.  FAILURE TO FOLLOW THESE INSTRUCTIONS MAY RESULT IN THE CANCELLATION OF YOUR SURGERY PATIENT SIGNATURE_________________________________  NURSE SIGNATURE__________________________________  ________________________________________________________________________   Rebekah Wells  An incentive spirometer is a  tool that can help keep your lungs clear and active. This tool measures how well you are filling your lungs with each breath. Taking long deep breaths may help reverse or decrease the chance of developing breathing (pulmonary) problems (especially infection) following:  A long period of time when you are unable to move or be active. BEFORE THE PROCEDURE   If the spirometer includes an indicator to show your best effort, your nurse or respiratory therapist will set it to a desired goal.  If possible, sit up straight or lean slightly forward. Try not to slouch.  Hold the incentive spirometer in an upright position. INSTRUCTIONS FOR USE  1. Sit on the edge of your bed if possible, or sit up as far as you can in bed or on a chair. 2. Hold the incentive spirometer in an upright position. 3. Breathe out normally. 4. Place the mouthpiece in your mouth and seal your lips tightly around it. 5. Breathe in slowly and as deeply as possible, raising the piston or the ball toward the top of the column. 6. Hold your breath for 3-5 seconds or for as long as possible. Allow the piston or ball to fall to the bottom of the column. 7. Remove the mouthpiece from your mouth and breathe out normally. 8. Rest for a few seconds and repeat Steps 1 through 7 at least 10 times every 1-2 hours when you are awake. Take your time and take a few normal breaths between deep breaths. 9. The spirometer may include an indicator to show your best effort. Use the indicator as a goal to work toward during each repetition. 10. After each set  of 10 deep breaths, practice coughing to be sure your lungs are clear. If you have an incision (the cut made at the time of surgery), support your incision when coughing by placing a pillow or rolled up towels firmly against it. Once you are able to get out of bed, walk around indoors and cough well. You may stop using the incentive spirometer when instructed by your caregiver.  RISKS AND  COMPLICATIONS  Take your time so you do not get dizzy or light-headed.  If you are in pain, you may need to take or ask for pain medication before doing incentive spirometry. It is harder to take a deep breath if you are having pain. AFTER USE  Rest and breathe slowly and easily.  It can be helpful to keep track of a log of your progress. Your caregiver can provide you with a simple table to help with this. If you are using the spirometer at home, follow these instructions: [place] IF:   You are having difficultly using the spirometer.  You have trouble using the spirometer as often as instructed.  Your pain medication is not giving enough relief while using the spirometer.  You develop fever of 100.5 F (38.1 C) or higher. SEEK IMMEDIATE MEDICAL CARE IF:   You cough up bloody sputum that had not been present before.  You develop fever of 102 F (38.9 C) or greater.  You develop worsening pain at or near the incision site. MAKE SURE YOU:   Understand these instructions.  Will watch your condition.  Will get help right away if you are not doing well or get worse. Document Released: 06/19/2006 Document Revised: 05/01/2011 Document Reviewed: 08/20/2006 Exit[employer] Patient Information 2014 Exit[employer], [place].   ________________________________________________________________________  WHAT IS A BLOOD TRANSFUSION? Blood Transfusion Information  A transfusion is the replacement of blood or some of its parts. Blood is made up of multiple cells which provide different functions.  Red blood cells carry oxygen and are used for blood loss replacement.  White blood cells fight against infection.  Platelets control bleeding.  Plasma helps clot blood.  Other blood products are available for specialized needs, such as hemophilia or other clotting disorders. BEFORE THE TRANSFUSION  Who gives blood for transfusions?   Healthy volunteers who are fully evaluated to make sure  their blood is safe. This is blood bank blood. Transfusion therapy is the safest it has ever been in the practice of medicine. Before blood is taken from a donor, a complete history is taken to make sure that person has no history of diseases nor engages in risky social behavior (examples are intravenous drug use or sexual activity with multiple partners). The donor's travel history is screened to minimize risk of transmitting infections, such as malaria. The donated blood is tested for signs of infectious diseases, such as HIV and hepatitis. The blood is then tested to be sure it is compatible with you in order to minimize the chance of a transfusion reaction. If you or a relative donates blood, this is often done in anticipation of surgery and is not appropriate for emergency situations. It takes many days to process the donated blood. RISKS AND COMPLICATIONS Although transfusion therapy is very safe and saves many lives, the main dangers of transfusion include:   Getting an infectious disease.  Developing a transfusion reaction. This is an allergic reaction to something in the blood you were given. Every precaution is taken to prevent this. The decision to have a  blood transfusion has been considered carefully by your caregiver before blood is given. Blood is not given unless the benefits outweigh the risks. AFTER THE TRANSFUSION  Right after receiving a blood transfusion, you will usually feel much better and more energetic. This is especially true if your red blood cells have gotten low (anemic). The transfusion raises the level of the red blood cells which carry oxygen, and this usually causes an energy increase.  The nurse administering the transfusion will monitor you carefully for complications. HOME CARE INSTRUCTIONS  No special instructions are needed after a transfusion. You may find your energy is better. Speak with your caregiver about any limitations on activity for underlying diseases  you may have. SEEK MEDICAL CARE IF:   Your condition is not improving after your transfusion.  You develop redness or irritation at the intravenous (IV) site. SEEK IMMEDIATE MEDICAL CARE IF:  Any of the following symptoms occur over the next 12 hours:  Shaking chills.  You have a temperature by mouth above 102 F (38.9 C), not controlled by medicine.  Chest, back, or muscle pain.  People around you feel you are not acting correctly or are confused.  Shortness of breath or difficulty breathing.  Dizziness and fainting.  You get a rash or develop hives.  You have a decrease in urine output.  Your urine turns a dark color or changes to pink, red, or brown. Any of the following symptoms occur over the next 10 days:  You have a temperature by mouth above 102 F (38.9 C), not controlled by medicine.  Shortness of breath.  Weakness after normal activity.  The white part of the eye turns yellow (jaundice).  You have a decrease in the amount of urine or are urinating less often.  Your urine turns a dark color or changes to pink, red, or brown. Document Released: 02/04/2000 Document Revised: 05/01/2011 Document Reviewed: 09/23/2007 [facility] Patient Information 2014 [place], [place].  _______________________________________________________________________

## 2016-11-16 ENCOUNTER — Encounter (HOSPITAL_COMMUNITY)
Admission: RE | Admit: 2016-11-16 | Discharge: 2016-11-16 | Disposition: A | Discharge status: Home / Self Care | Payer: Medicare Other | Source: Ambulatory Visit | Attending: Orthopedic Surgery | Admitting: Orthopedic Surgery

## 2016-11-16 ENCOUNTER — Encounter (HOSPITAL_COMMUNITY): Payer: Self-pay

## 2016-11-16 DIAGNOSIS — Z01812 Encounter for preprocedural laboratory examination: Secondary | ICD-10-CM | POA: Insufficient documentation

## 2016-11-16 DIAGNOSIS — M1711 Unilateral primary osteoarthritis, right knee: Secondary | ICD-10-CM | POA: Diagnosis not present

## 2016-11-16 HISTORY — DX: Cardiac arrhythmia, unspecified: I49.9

## 2016-11-16 LAB — BASIC METABOLIC PANEL
ANION GAP: 10 (ref 5–15)
BUN: 18 mg/dL (ref 6–20)
CHLORIDE: 106 mmol/L (ref 101–111)
CO2: 24 mmol/L (ref 22–32)
Calcium: 9.4 mg/dL (ref 8.9–10.3)
Creatinine, Ser: 0.65 mg/dL (ref 0.44–1.00)
GFR calc non Af Amer: >60 mL/min (ref >60)
Glucose, Bld: 78 mg/dL (ref 65–99)
POTASSIUM: 4.1 mmol/L (ref 3.5–5.1)
Sodium: 140 mmol/L (ref 135–145)

## 2016-11-16 LAB — CBC
HCT: 41.5 % (ref 36.0–46.0)
HEMOGLOBIN: 13.5 g/dL (ref 12.0–15.0)
MCH: 29.2 pg (ref 26.0–34.0)
MCHC: 32.5 g/dL (ref 30.0–36.0)
MCV: 89.8 fL (ref 78.0–100.0)
Platelets: 240 10*3/uL (ref 150–400)
RBC: 4.62 MIL/uL (ref 3.87–5.11)
RDW: 12.6 % (ref 11.5–15.5)
WBC: 8.8 10*3/uL (ref 4.0–10.5)

## 2016-11-16 LAB — SURGICAL PCR SCREEN
MRSA, PCR: NEGATIVE
Staphylococcus aureus: NEGATIVE

## 2016-11-16 NOTE — Progress Notes (Signed)
11/15/2016-Medical clearance from Dr. Elyn Aquas. Crews and last office note on chart.

## 2016-11-16 NOTE — H&P (Signed)
TOTAL KNEE ADMISSION H&P  Patient is being admitted for right total knee arthroplasty and removal of right great toe hardware  Subjective:  Chief Complaint:  Right knee primary OA / pain  2.  Right great toe with retained hardware  HPI: Rebekah Wells, 76 y.o. female, has a history of pain and functional disability in the right knee due to arthritis and has failed non-surgical conservative treatments for greater than 12 weeks to includeNSAID's and/or analgesics, corticosteriod injections and activity modification.  Onset of symptoms was gradual, starting 2+ years ago with gradually worsening course since that time. The patient noted prior procedures on the knee to include  arthroscopy on the right knee(s).  Patient currently rates pain in the right knee(s) at 7 out of 10 with activity. Patient has night pain, worsening of pain with activity and weight bearing, pain that interferes with activities of daily living, pain with passive range of motion, crepitus and joint swelling.  Patient has evidence of periarticular osteophytes and joint space narrowing by imaging studies. Retained hardware in the right great toe, which is painful.  Patient has had this for some time, but just recently it has started to become painful.  Discussion was had to remove the hardware to help reduce her pain in the toe. There is no active infection.   Risks, benefits and expectations were discussed with the patient.  Risks including but not limited to the risk of anesthesia, blood clots, nerve damage, blood vessel damage, failure of the prosthesis, infection and up to and including death.  Patient understand the risks, benefits and expectations and wishes to proceed with surgery.   PCP: Lonna Cobb, MD  D/C Plans:       Home   Post-op Meds:       No Rx given   Tranexamic Acid:      To be given - IV   Decadron:      Is to be given  FYI:     ASA  [place]  DME:   Pt already has equipment   PT:   OPPT Rx given   Past  Medical History:  Diagnosis Date  . Arthritis    OA  . Asthma   . Gastritis   . GERD (gastroesophageal reflux disease)   . H/O seasonal allergies   . Hyperlipemia     Past Surgical History:  Procedure Laterality Date  . ABDOMINAL HYSTERECTOMY    . CARPAL TUNNEL RELEASE Left 07/09/2014   Procedure: LEFT CARPAL TUNNEL RELEASE;  Surgeon: Daryll Brod, MD;  Location: [facility];  Service: Orthopedics;  Laterality: Left;  . CHOLECYSTECTOMY    . DG TOES RT FOOT MULTIPLE SPECIF ([facility] HX) Right   . DIAGNOSTIC LAPAROSCOPY    . HAND ARTHROPLASTY Right    for OA  . KNEE ARTHROSCOPY     x3  . NISSEN FUNDOPLICATION    . TONSILLECTOMY      No prescriptions prior to admission.   Allergies  Allergen Reactions  . Penicillins Rash  . Sulfa Antibiotics Rash    Social History  Substance Use Topics  . Smoking status: Never Smoker  . Smokeless tobacco: Never Used  . Alcohol use No    Family History  Problem Relation Age of Onset  . Colon cancer Mother   . CAD Father 56  . Lung cancer Sister      Review of Systems  Constitutional: Negative.   HENT: Negative.   Eyes: Negative.   Respiratory: Negative.  Cardiovascular: Negative.   Gastrointestinal: Positive for heartburn.  Genitourinary: Negative.   Musculoskeletal: Positive for joint pain.  Skin: Negative.   Neurological: Negative.   Endo/Heme/Allergies: Positive for environmental allergies.  Psychiatric/Behavioral: Negative.     Objective:  Physical Exam  Constitutional: She is oriented to person, place, and time. She appears well-developed.  HENT:  Head: Normocephalic.  Eyes: Pupils are equal, round, and reactive to light.  Neck: Neck supple. No JVD present. No tracheal deviation present. No thyromegaly present.  Cardiovascular: Normal rate, regular rhythm, normal heart sounds and intact distal pulses.   Respiratory: Effort normal and breath sounds normal. No stridor. No respiratory distress. She has no  wheezes.  GI: Soft. There is no tenderness. There is no guarding.  Musculoskeletal:       Right knee: She exhibits decreased range of motion, swelling and bony tenderness. She exhibits no ecchymosis, no deformity, no laceration and no erythema. Tenderness found.       Feet:  Lymphadenopathy:    She has no cervical adenopathy.  Neurological: She is alert and oriented to person, place, and time.  Skin: Skin is warm and dry.  Psychiatric: She has a normal mood and affect.      Labs:  Estimated body mass index is 26.04 kg/m as calculated from the following:   Height as of 06/28/16: 5\' 3"  (1.6 m).   Weight as of 06/28/16: 66.7 kg (147 lb).   Imaging Review Plain radiographs demonstrate severe degenerative joint disease of the right knee.  The bone quality appears to be good for age and reported activity level.  Retained hardware right great toe.  Assessment/Plan:  1.  End stage arthritis, right knee.     2. Retained orthopaedic hardware right great toe.  The patient history, physical examination, clinical judgment of the provider and imaging studies are consistent with end stage degenerative joint disease of the right knee(s) and total knee arthroplasty is deemed medically necessary. The treatment options including medical management, injection therapy arthroscopy and arthroplasty were discussed at length. The risks and benefits of total knee arthroplasty were presented and reviewed. The risks due to aseptic loosening, infection, stiffness, patella tracking problems, thromboembolic complications and other imponderables were discussed. The patient acknowledged the explanation, agreed to proceed with the plan and consent was signed. Patient is being admitted for inpatient treatment for surgery, pain control, PT, OT, prophylactic antibiotics, VTE prophylaxis, progressive ambulation and ADL's and discharge planning.  Patient also has retained hardware in the right great toe which is painful.   Discussion was had and it was determined that this could be removed to try to alleviate the pain she is having  The patient is planning to be discharged home.     [place] Pugh Carlisle Torgeson   PA-C  11/16/2016, 2:17 PM

## 2016-11-17 NOTE — Progress Notes (Signed)
11/15/2016- Labs, CBC BMP and EKG on chart from Dr. Raquel Sarna from [place], [place]. On chart

## 2016-12-04 NOTE — Anesthesia Preprocedure Evaluation (Signed)
Anesthesia Evaluation  Patient identified by MRN, date of birth, ID band Patient awake    Reviewed: Allergy & Precautions, NPO status , Patient's Chart, lab work & pertinent test results  Airway Mallampati: I  TM Distance: >3 FB Neck ROM: Full    Dental  (+) Upper Dentures, Lower Dentures, Dental Advisory Given   Pulmonary asthma ,    breath sounds clear to auscultation       Cardiovascular hypertension, Pt. on medications and Pt. on home beta blockers  Rhythm:Regular Rate:Normal     Neuro/Psych    GI/Hepatic GERD  Medicated and Controlled,  Endo/Other    Renal/GU      Musculoskeletal  (+) Arthritis , Osteoarthritis,    Abdominal   Peds  Hematology   Anesthesia Other Findings   Reproductive/Obstetrics                             Anesthesia Physical  Anesthesia Plan  ASA: II  Anesthesia Plan: Spinal   Post-op Pain Management:  Regional for Post-op pain   Induction:   PONV Risk Score and Plan: 2 and Ondansetron, Dexamethasone and Propofol infusion  Airway Management Planned: Natural Airway and Nasal Cannula  Additional Equipment:   Intra-op Plan:   Post-operative Plan: Extubation in OR  Informed Consent: I have reviewed the patients History and Physical, chart, labs and discussed the procedure including the risks, benefits and alternatives for the proposed anesthesia with the patient or authorized representative who has indicated his/her understanding and acceptance.   Dental advisory given  Plan Discussed with: CRNA, Anesthesiologist and Surgeon  Anesthesia Plan Comments: (  )        Anesthesia Quick Evaluation

## 2016-12-05 ENCOUNTER — Encounter (HOSPITAL_COMMUNITY): Payer: Self-pay

## 2016-12-05 ENCOUNTER — Encounter (HOSPITAL_COMMUNITY)
Admission: RE | Disposition: A | Discharge status: Home / Self Care | Payer: Self-pay | Source: Ambulatory Visit | Attending: Orthopedic Surgery

## 2016-12-05 ENCOUNTER — Inpatient Hospital Stay (HOSPITAL_COMMUNITY): Payer: Medicare Other | Admitting: Anesthesiology

## 2016-12-05 ENCOUNTER — Observation Stay (HOSPITAL_COMMUNITY)
Admission: RE | Admit: 2016-12-05 | Discharge: 2016-12-06 | Disposition: A | Discharge status: Home / Self Care | Payer: Medicare Other | Source: Ambulatory Visit | Attending: Orthopedic Surgery | Admitting: Orthopedic Surgery

## 2016-12-05 DIAGNOSIS — T8489XA Other specified complication of internal orthopedic prosthetic devices, implants and grafts, initial encounter: Secondary | ICD-10-CM | POA: Diagnosis not present

## 2016-12-05 DIAGNOSIS — Z96659 Presence of unspecified artificial knee joint: Secondary | ICD-10-CM

## 2016-12-05 DIAGNOSIS — Z7951 Long term (current) use of inhaled steroids: Secondary | ICD-10-CM | POA: Insufficient documentation

## 2016-12-05 DIAGNOSIS — J45909 Unspecified asthma, uncomplicated: Secondary | ICD-10-CM | POA: Diagnosis not present

## 2016-12-05 DIAGNOSIS — Z23 Encounter for immunization: Secondary | ICD-10-CM | POA: Insufficient documentation

## 2016-12-05 DIAGNOSIS — Y838 Other surgical procedures as the cause of abnormal reaction of the patient, or of later complication, without mention of misadventure at the time of the procedure: Secondary | ICD-10-CM | POA: Diagnosis not present

## 2016-12-05 DIAGNOSIS — M170 Bilateral primary osteoarthritis of knee: Secondary | ICD-10-CM | POA: Diagnosis not present

## 2016-12-05 DIAGNOSIS — Z882 Allergy status to sulfonamides status: Secondary | ICD-10-CM | POA: Insufficient documentation

## 2016-12-05 DIAGNOSIS — E785 Hyperlipidemia, unspecified: Secondary | ICD-10-CM | POA: Diagnosis not present

## 2016-12-05 DIAGNOSIS — Z880 Allergy status to penicillin: Secondary | ICD-10-CM | POA: Insufficient documentation

## 2016-12-05 DIAGNOSIS — Z79899 Other long term (current) drug therapy: Secondary | ICD-10-CM | POA: Diagnosis not present

## 2016-12-05 DIAGNOSIS — K219 Gastro-esophageal reflux disease without esophagitis: Secondary | ICD-10-CM | POA: Insufficient documentation

## 2016-12-05 DIAGNOSIS — Z96651 Presence of right artificial knee joint: Secondary | ICD-10-CM

## 2016-12-05 HISTORY — PX: DIGIT NAIL REMOVAL: SHX5052

## 2016-12-05 HISTORY — PX: TOTAL KNEE ARTHROPLASTY: SHX125

## 2016-12-05 LAB — TYPE AND SCREEN
ABO/RH(D): B POS
Antibody Screen: NEGATIVE

## 2016-12-05 LAB — ABO/RH: ABO/RH(D): B POS

## 2016-12-05 SURGERY — ARTHROPLASTY, KNEE, TOTAL
Anesthesia: Spinal | Site: Toe | Laterality: Right

## 2016-12-05 MED ORDER — FENTANYL CITRATE (PF) 100 MCG/2ML IJ SOLN
INTRAMUSCULAR | Status: AC
  Filled 2016-12-05: qty 2

## 2016-12-05 MED ORDER — METOPROLOL SUCCINATE ER 25 MG PO TB24
25.0000 mg | ORAL_TABLET | Freq: Every day | ORAL | Status: DC
  Administered 2016-12-05: 25 mg via ORAL
  Filled 2016-12-05: qty 1

## 2016-12-05 MED ORDER — PROPOFOL 10 MG/ML IV BOLUS
INTRAVENOUS | Status: AC
  Filled 2016-12-05: qty 20

## 2016-12-05 MED ORDER — FENTANYL CITRATE (PF) 100 MCG/2ML IJ SOLN
50.0000 ug | INTRAMUSCULAR | Status: DC | PRN
  Administered 2016-12-05: 100 ug via INTRAVENOUS

## 2016-12-05 MED ORDER — HYDROMORPHONE HCL-NACL 0.5-0.9 MG/ML-% IV SOSY
0.5000 mg | PREFILLED_SYRINGE | INTRAVENOUS | Status: DC | PRN
  Administered 2016-12-05: 0.5 mg via INTRAVENOUS
  Filled 2016-12-05: qty 1

## 2016-12-05 MED ORDER — HYDROCODONE-ACETAMINOPHEN 7.5-325 MG PO TABS: 1.0000 | ORAL_TABLET | ORAL | 0 refills | Status: DC | PRN

## 2016-12-05 MED ORDER — SODIUM CHLORIDE 0.9 % IJ SOLN
INTRAMUSCULAR | Status: AC
  Filled 2016-12-05: qty 50

## 2016-12-05 MED ORDER — ONDANSETRON HCL 4 MG/2ML IJ SOLN
4.0000 mg | Freq: Four times a day (QID) | INTRAMUSCULAR | Status: DC | PRN
  Administered 2016-12-05: 4 mg via INTRAVENOUS
  Filled 2016-12-05: qty 2

## 2016-12-05 MED ORDER — ONDANSETRON HCL 4 MG/2ML IJ SOLN
INTRAMUSCULAR | Status: AC
  Filled 2016-12-05: qty 2

## 2016-12-05 MED ORDER — PROPOFOL 500 MG/50ML IV EMUL
INTRAVENOUS | Status: DC | PRN
  Administered 2016-12-05: 100 ug/kg/min via INTRAVENOUS

## 2016-12-05 MED ORDER — CEFAZOLIN SODIUM-DEXTROSE 2-4 GM/100ML-% IV SOLN
INTRAVENOUS | Status: AC
  Filled 2016-12-05: qty 100

## 2016-12-05 MED ORDER — MIDAZOLAM HCL 2 MG/2ML IJ SOLN
1.0000 mg | INTRAMUSCULAR | Status: DC | PRN
  Administered 2016-12-05: 2 mg via INTRAVENOUS

## 2016-12-05 MED ORDER — DEXTROSE 5 % IV SOLN
500.0000 mg | Freq: Four times a day (QID) | INTRAVENOUS | Status: DC | PRN
  Administered 2016-12-05: 500 mg via INTRAVENOUS
  Filled 2016-12-05: qty 550

## 2016-12-05 MED ORDER — ALBUTEROL SULFATE (2.5 MG/3ML) 0.083% IN NEBU: 3.0000 mL | INHALATION_SOLUTION | Freq: Four times a day (QID) | RESPIRATORY_TRACT | Status: DC | PRN

## 2016-12-05 MED ORDER — ASPIRIN 81 MG PO CHEW: 81.0000 mg | CHEWABLE_TABLET | Freq: Two times a day (BID) | ORAL | 0 refills | Status: AC

## 2016-12-05 MED ORDER — PANTOPRAZOLE SODIUM 40 MG PO TBEC: 40.0000 mg | DELAYED_RELEASE_TABLET | Freq: Every day | ORAL | Status: DC | PRN

## 2016-12-05 MED ORDER — METHOCARBAMOL 500 MG PO TABS: 500.0000 mg | ORAL_TABLET | Freq: Four times a day (QID) | ORAL | 0 refills | Status: DC | PRN

## 2016-12-05 MED ORDER — FLUTICASONE FUROATE-VILANTEROL 100-25 MCG/INH IN AEPB
1.0000 | INHALATION_SPRAY | Freq: Every day | RESPIRATORY_TRACT | Status: DC
  Administered 2016-12-06: 1 via RESPIRATORY_TRACT
  Filled 2016-12-05: qty 28

## 2016-12-05 MED ORDER — ONDANSETRON HCL 4 MG PO TABS: 4.0000 mg | ORAL_TABLET | Freq: Four times a day (QID) | ORAL | Status: DC | PRN

## 2016-12-05 MED ORDER — CEFAZOLIN SODIUM-DEXTROSE 2-4 GM/100ML-% IV SOLN
2.0000 g | INTRAVENOUS | Status: AC
  Administered 2016-12-05: 2 g via INTRAVENOUS

## 2016-12-05 MED ORDER — METOCLOPRAMIDE HCL 5 MG/ML IJ SOLN: 5.0000 mg | Freq: Three times a day (TID) | INTRAMUSCULAR | Status: DC | PRN

## 2016-12-05 MED ORDER — TRANEXAMIC ACID 1000 MG/10ML IV SOLN
1000.0000 mg | INTRAVENOUS | Status: AC
  Administered 2016-12-05: 1000 mg via INTRAVENOUS
  Filled 2016-12-05: qty 10

## 2016-12-05 MED ORDER — POLYETHYLENE GLYCOL 3350 17 G PO PACK: 17.0000 g | PACK | Freq: Two times a day (BID) | ORAL | 0 refills | Status: DC

## 2016-12-05 MED ORDER — ALUM & MAG HYDROXIDE-SIMETH 200-200-20 MG/5ML PO SUSP: 15.0000 mL | ORAL | Status: DC | PRN

## 2016-12-05 MED ORDER — SODIUM CHLORIDE 0.9 % IJ SOLN
INTRAMUSCULAR | Status: DC | PRN
  Administered 2016-12-05: 30 mL

## 2016-12-05 MED ORDER — FENTANYL CITRATE (PF) 100 MCG/2ML IJ SOLN
INTRAMUSCULAR | Status: AC
  Administered 2016-12-05: 100 ug via INTRAVENOUS
  Filled 2016-12-05: qty 2

## 2016-12-05 MED ORDER — FERROUS SULFATE 325 (65 FE) MG PO TABS: 325.0000 mg | ORAL_TABLET | Freq: Three times a day (TID) | ORAL | 3 refills | Status: DC

## 2016-12-05 MED ORDER — FERROUS SULFATE 325 (65 FE) MG PO TABS
325.0000 mg | ORAL_TABLET | Freq: Three times a day (TID) | ORAL | Status: DC
  Administered 2016-12-06 (×2): 325 mg via ORAL
  Filled 2016-12-05 (×2): qty 1

## 2016-12-05 MED ORDER — POLYETHYLENE GLYCOL 3350 17 G PO PACK
17.0000 g | PACK | Freq: Two times a day (BID) | ORAL | Status: DC
Start: 2016-12-05 — End: 2016-12-06

## 2016-12-05 MED ORDER — BUPIVACAINE-EPINEPHRINE (PF) 0.25% -1:200000 IJ SOLN
INTRAMUSCULAR | Status: AC
  Filled 2016-12-05: qty 30

## 2016-12-05 MED ORDER — SODIUM CHLORIDE 0.9 % IV SOLN
INTRAVENOUS | Status: DC
  Administered 2016-12-05 – 2016-12-06 (×2): via INTRAVENOUS

## 2016-12-05 MED ORDER — DOCUSATE SODIUM 100 MG PO CAPS
100.0000 mg | ORAL_CAPSULE | Freq: Two times a day (BID) | ORAL | Status: DC
  Administered 2016-12-05 – 2016-12-06 (×2): 100 mg via ORAL
  Filled 2016-12-05 (×2): qty 1

## 2016-12-05 MED ORDER — FENTANYL CITRATE (PF) 100 MCG/2ML IJ SOLN: 25.0000 ug | INTRAMUSCULAR | Status: DC | PRN

## 2016-12-05 MED ORDER — ONDANSETRON HCL 4 MG/2ML IJ SOLN
INTRAMUSCULAR | Status: DC | PRN
  Administered 2016-12-05: 4 mg via INTRAVENOUS

## 2016-12-05 MED ORDER — TRAZODONE HCL 50 MG PO TABS: 25.0000 mg | ORAL_TABLET | Freq: Every evening | ORAL | Status: DC | PRN

## 2016-12-05 MED ORDER — BUPIVACAINE IN DEXTROSE 0.75-8.25 % IT SOLN
INTRATHECAL | Status: DC | PRN
  Administered 2016-12-05: 1.6 mL via INTRATHECAL

## 2016-12-05 MED ORDER — DOCUSATE SODIUM 100 MG PO CAPS: 100.0000 mg | ORAL_CAPSULE | Freq: Two times a day (BID) | ORAL | 0 refills | Status: DC

## 2016-12-05 MED ORDER — PROPOFOL 10 MG/ML IV BOLUS
INTRAVENOUS | Status: AC
  Filled 2016-12-05: qty 40

## 2016-12-05 MED ORDER — 0.9 % SODIUM CHLORIDE (POUR BTL) OPTIME
TOPICAL | Status: DC | PRN
  Administered 2016-12-05: 1000 mL

## 2016-12-05 MED ORDER — LORATADINE 10 MG PO TABS
10.0000 mg | ORAL_TABLET | Freq: Every day | ORAL | Status: DC
  Administered 2016-12-05: 10 mg via ORAL
  Filled 2016-12-05: qty 1

## 2016-12-05 MED ORDER — PROPOFOL 10 MG/ML IV BOLUS
INTRAVENOUS | Status: DC | PRN
  Administered 2016-12-05: 30 mg via INTRAVENOUS

## 2016-12-05 MED ORDER — SIMVASTATIN 20 MG PO TABS
20.0000 mg | ORAL_TABLET | Freq: Every day | ORAL | Status: DC
  Administered 2016-12-05: 20 mg via ORAL
  Filled 2016-12-05: qty 1

## 2016-12-05 MED ORDER — DIPHENHYDRAMINE HCL 25 MG PO CAPS: 25.0000 mg | ORAL_CAPSULE | Freq: Four times a day (QID) | ORAL | Status: DC | PRN

## 2016-12-05 MED ORDER — METHOCARBAMOL 500 MG PO TABS
500.0000 mg | ORAL_TABLET | Freq: Four times a day (QID) | ORAL | Status: DC | PRN
  Administered 2016-12-05 – 2016-12-06 (×2): 500 mg via ORAL
  Filled 2016-12-05 (×2): qty 1

## 2016-12-05 MED ORDER — BUPIVACAINE-EPINEPHRINE (PF) 0.25% -1:200000 IJ SOLN
INTRAMUSCULAR | Status: DC | PRN
  Administered 2016-12-05: 30 mL

## 2016-12-05 MED ORDER — STERILE WATER FOR IRRIGATION IR SOLN
Status: DC | PRN
  Administered 2016-12-05: 2000 mL

## 2016-12-05 MED ORDER — CEFAZOLIN SODIUM-DEXTROSE 2-4 GM/100ML-% IV SOLN
2.0000 g | Freq: Four times a day (QID) | INTRAVENOUS | Status: AC
  Administered 2016-12-05 (×2): 2 g via INTRAVENOUS
  Filled 2016-12-05 (×2): qty 100

## 2016-12-05 MED ORDER — METOCLOPRAMIDE HCL 5 MG PO TABS
5.0000 mg | ORAL_TABLET | Freq: Three times a day (TID) | ORAL | Status: DC | PRN
  Administered 2016-12-06: 5 mg via ORAL
  Filled 2016-12-05: qty 1

## 2016-12-05 MED ORDER — KETOROLAC TROMETHAMINE 30 MG/ML IJ SOLN
INTRAMUSCULAR | Status: AC
  Filled 2016-12-05: qty 1

## 2016-12-05 MED ORDER — TRANEXAMIC ACID 1000 MG/10ML IV SOLN
1000.0000 mg | Freq: Once | INTRAVENOUS | Status: AC
  Administered 2016-12-05: 1000 mg via INTRAVENOUS
  Filled 2016-12-05: qty 1100

## 2016-12-05 MED ORDER — MIDAZOLAM HCL 2 MG/2ML IJ SOLN
INTRAMUSCULAR | Status: AC
  Filled 2016-12-05: qty 2

## 2016-12-05 MED ORDER — CHLORHEXIDINE GLUCONATE 4 % EX LIQD: 60.0000 mL | Freq: Once | CUTANEOUS | Status: DC

## 2016-12-05 MED ORDER — HYDROCODONE-ACETAMINOPHEN 7.5-325 MG PO TABS
1.0000 | ORAL_TABLET | ORAL | Status: DC
  Administered 2016-12-05: 2 via ORAL
  Administered 2016-12-05: 1 via ORAL
  Administered 2016-12-05 – 2016-12-06 (×5): 2 via ORAL
  Filled 2016-12-05: qty 2
  Filled 2016-12-05: qty 1
  Filled 2016-12-05 (×5): qty 2

## 2016-12-05 MED ORDER — LACTATED RINGERS IV SOLN
INTRAVENOUS | Status: DC
  Administered 2016-12-05: 1000 mL via INTRAVENOUS
  Administered 2016-12-05 (×2): via INTRAVENOUS

## 2016-12-05 MED ORDER — KETOROLAC TROMETHAMINE 30 MG/ML IJ SOLN
INTRAMUSCULAR | Status: DC | PRN
  Administered 2016-12-05: 30 mg

## 2016-12-05 MED ORDER — MAGNESIUM CITRATE PO SOLN: 1.0000 | Freq: Once | ORAL | Status: DC | PRN

## 2016-12-05 MED ORDER — MENTHOL 3 MG MT LOZG: 1.0000 | LOZENGE | OROMUCOSAL | Status: DC | PRN

## 2016-12-05 MED ORDER — MIDAZOLAM HCL 2 MG/2ML IJ SOLN
INTRAMUSCULAR | Status: AC
  Administered 2016-12-05: 2 mg via INTRAVENOUS
  Filled 2016-12-05: qty 2

## 2016-12-05 MED ORDER — DEXAMETHASONE SODIUM PHOSPHATE 10 MG/ML IJ SOLN
10.0000 mg | Freq: Once | INTRAMUSCULAR | Status: AC
  Administered 2016-12-05: 10 mg via INTRAVENOUS

## 2016-12-05 MED ORDER — BISACODYL 10 MG RE SUPP: 10.0000 mg | Freq: Every day | RECTAL | Status: DC | PRN

## 2016-12-05 MED ORDER — ROPIVACAINE HCL 7.5 MG/ML IJ SOLN
INTRAMUSCULAR | Status: DC | PRN
  Administered 2016-12-05: 25 mL via PERINEURAL

## 2016-12-05 MED ORDER — PHENOL 1.4 % MT LIQD
1.0000 | OROMUCOSAL | Status: DC | PRN
  Filled 2016-12-05: qty 177

## 2016-12-05 MED ORDER — ASPIRIN 81 MG PO CHEW
81.0000 mg | CHEWABLE_TABLET | Freq: Two times a day (BID) | ORAL | Status: DC
  Administered 2016-12-05 – 2016-12-06 (×2): 81 mg via ORAL
  Filled 2016-12-05 (×2): qty 1

## 2016-12-05 MED ORDER — DEXAMETHASONE SODIUM PHOSPHATE 10 MG/ML IJ SOLN
10.0000 mg | Freq: Once | INTRAMUSCULAR | Status: AC
  Administered 2016-12-06: 10 mg via INTRAVENOUS
  Filled 2016-12-05: qty 1

## 2016-12-05 MED ORDER — MEPERIDINE HCL 50 MG/ML IJ SOLN: 6.2500 mg | INTRAMUSCULAR | Status: DC | PRN

## 2016-12-05 SURGICAL SUPPLY — 52 items
BAG DECANTER FOR FLEXI CONT (MISCELLANEOUS) IMPLANT
BAG URINE DRAINAGE (UROLOGICAL SUPPLIES) ×3 IMPLANT
BAG ZIPLOCK 12X15 (MISCELLANEOUS) ×3 IMPLANT
BANDAGE ACE 6X5 VEL STRL LF (GAUZE/BANDAGES/DRESSINGS) ×3 IMPLANT
BANDAGE COBAN STERILE 2 (GAUZE/BANDAGES/DRESSINGS) ×3 IMPLANT
BLADE 10 SAFETY STRL DISP (BLADE) ×3 IMPLANT
BLADE SAW SGTL 11.0X1.19X90.0M (BLADE) IMPLANT
BLADE SAW SGTL 13.0X1.19X90.0M (BLADE) ×3 IMPLANT
BOWL SMART MIX CTS (DISPOSABLE) ×3 IMPLANT
CAPT KNEE TOTAL 3 ATTUNE ×3 IMPLANT
CEMENT HV SMART SET (Cement) ×6 IMPLANT
COVER SURGICAL LIGHT HANDLE (MISCELLANEOUS) ×3 IMPLANT
CUFF TOURN SGL QUICK 34 (TOURNIQUET CUFF) ×1
CUFF TRNQT CYL 34X4X40X1 (TOURNIQUET CUFF) ×2 IMPLANT
DECANTER SPIKE VIAL GLASS SM (MISCELLANEOUS) ×3 IMPLANT
DERMABOND ADVANCED (GAUZE/BANDAGES/DRESSINGS) ×1
DERMABOND ADVANCED .7 DNX12 (GAUZE/BANDAGES/DRESSINGS) ×2 IMPLANT
DRAPE U-SHAPE 47X51 STRL (DRAPES) ×3 IMPLANT
DRESSING AQUACEL AG SP 3.5X10 (GAUZE/BANDAGES/DRESSINGS) ×2 IMPLANT
DRSG AQUACEL AG SP 3.5X10 (GAUZE/BANDAGES/DRESSINGS) ×3
DURAPREP 26ML APPLICATOR (WOUND CARE) ×6 IMPLANT
ELECT REM PT RETURN 15FT ADLT (MISCELLANEOUS) ×3 IMPLANT
GAUZE SPONGE 4X4 12PLY STRL (GAUZE/BANDAGES/DRESSINGS) ×6 IMPLANT
GAUZE XEROFORM 1X8 LF (GAUZE/BANDAGES/DRESSINGS) ×3 IMPLANT
GLOVE BIOGEL M 7.0 STRL (GLOVE) IMPLANT
GLOVE BIOGEL PI IND STRL 7.5 (GLOVE) ×2 IMPLANT
GLOVE BIOGEL PI IND STRL 8.5 (GLOVE) ×2 IMPLANT
GLOVE BIOGEL PI INDICATOR 7.5 (GLOVE) ×1
GLOVE BIOGEL PI INDICATOR 8.5 (GLOVE) ×1
GLOVE ECLIPSE 8.0 STRL XLNG CF (GLOVE) ×6 IMPLANT
GLOVE ORTHO TXT STRL SZ7.5 (GLOVE) ×9 IMPLANT
GLOVE SURG SS PI 7.5 STRL IVOR (GLOVE) ×3 IMPLANT
GOWN STRL REUS W/TWL LRG LVL3 (GOWN DISPOSABLE) ×3 IMPLANT
GOWN STRL REUS W/TWL XL LVL3 (GOWN DISPOSABLE) ×3 IMPLANT
HANDPIECE INTERPULSE COAX TIP (DISPOSABLE) ×1
MANIFOLD NEPTUNE II (INSTRUMENTS) ×3 IMPLANT
PACK TOTAL KNEE CUSTOM (KITS) ×3 IMPLANT
POSITIONER SURGICAL ARM (MISCELLANEOUS) ×3 IMPLANT
SET HNDPC FAN SPRY TIP SCT (DISPOSABLE) ×2 IMPLANT
SET PAD KNEE POSITIONER (MISCELLANEOUS) ×3 IMPLANT
SUT ETHILON 3 0 PS 1 (SUTURE) ×3 IMPLANT
SUT MNCRL AB 4-0 PS2 18 (SUTURE) ×3 IMPLANT
SUT STRATAFIX 0 PDS 27 VIOLET (SUTURE) ×3
SUT VIC AB 1 CT1 36 (SUTURE) ×3 IMPLANT
SUT VIC AB 2-0 CT1 27 (SUTURE) ×3
SUT VIC AB 2-0 CT1 TAPERPNT 27 (SUTURE) ×6 IMPLANT
SUTURE STRATFX 0 PDS 27 VIOLET (SUTURE) ×2 IMPLANT
SYR 50ML LL SCALE MARK (SYRINGE) IMPLANT
TRAY FOLEY W/METER SILVER 16FR (SET/KITS/TRAYS/PACK) IMPLANT
WATER STERILE IRR 1500ML POUR (IV SOLUTION) ×6 IMPLANT
WRAP KNEE MAXI GEL POST OP (GAUZE/BANDAGES/DRESSINGS) ×3 IMPLANT
YANKAUER SUCT BULB TIP 10FT TU (MISCELLANEOUS) ×3 IMPLANT

## 2016-12-05 NOTE — Anesthesia Postprocedure Evaluation (Signed)
Anesthesia Post Note  Patient: Rebekah Wells  Procedure(s) Performed: RIGHT TOTAL KNEE ARTHROPLASTY (Right Knee) REMOVAL OF SCREW RIGHT GREAT TOE (Right Toe)     Patient location during evaluation: PACU Anesthesia Type: Spinal Level of consciousness: oriented and awake and alert Pain management: pain level controlled Vital Signs Assessment: post-procedure vital signs reviewed and stable Respiratory status: spontaneous breathing, respiratory function stable and patient connected to nasal cannula oxygen Cardiovascular status: blood pressure returned to baseline and stable Postop Assessment: no headache, no backache and no apparent nausea or vomiting Anesthetic complications: no    Last Vitals:  Vitals:   12/05/16 1000 12/05/16 1019  BP: 126/70 104/75  Pulse: 78 78  Resp: 15 16  Temp: 37 C 36.8 C  SpO2: 97% 96%    Last Pain:  Vitals:   12/05/16 1000  TempSrc:   PainSc: 0-No pain                 Cina Klumpp

## 2016-12-05 NOTE — Brief Op Note (Signed)
12/05/2016  8:54 AM  PATIENT:  Rebekah Wells  76 y.o. female  PRE-OPERATIVE DIAGNOSIS:  Painful right great toe retained hardware  POST-OPERATIVE DIAGNOSIS:  Painful right great toe retained hardware  PROCEDURE:  Procedure(s) with comments: RIGHT TOTAL KNEE ARTHROPLASTY (Right) - 90 mins REMOVAL OF SCREW RIGHT GREAT TOE (Right)  SURGEON:  Surgeon(s) and Role:    Paralee Cancel, MD - Primary  PHYSICIAN ASSISTANT: None for removal of hardware portion of case   ANESTHESIA:   regional and spinal  EBL:  No intake/output data recorded.  BLOOD ADMINISTERED:none  DRAINS: none   LOCAL MEDICATIONS USED:  NONE  SPECIMEN:  No Specimen  DISPOSITION OF SPECIMEN:  N/A  COUNTS:  YES  TOURNIQUET:   Not utilized during this portion of the case   DICTATION: .Other Dictation: Dictation Number [id number]  PLAN OF CARE: Admit for overnight observation  PATIENT DISPOSITION:  PACU - hemodynamically stable.   Delay start of Pharmacological VTE agent (>24hrs) due to surgical blood loss or risk of bleeding: no

## 2016-12-05 NOTE — Progress Notes (Signed)
Disposition plans: home with spouse, Pt already has equipment, plan for OPPT. [phone]

## 2016-12-05 NOTE — Evaluation (Signed)
Physical Therapy Evaluation Patient Details Name: Rebekah Wells MRN: [id number] DOB: 1940/12/05 Today's Date: 12/05/2016   History of Present Illness  76 y.o. female admitted for R TKA and removal of hardware from R great toe. PMH of GERD, asthma.  Clinical Impression  Pt is s/p TKA resulting in the deficits listed below (see PT Problem List). Pt ambulated 71' with RW and performed TKA HEP with min assist. Good progress expected.  Pt will benefit from skilled PT to increase their independence and safety with mobility to allow discharge to the venue listed below.      Follow Up Recommendations Outpatient PT    Equipment Recommendations  None recommended by PT    Recommendations for Other Services       Precautions / Restrictions Precautions Precautions: Knee Restrictions Weight Bearing Restrictions: No Other Position/Activity Restrictions: WBAT      Mobility  Bed Mobility Overal bed mobility: Modified Independent             General bed mobility comments: with bedrail  Transfers Overall transfer level: Needs assistance Equipment used: Rolling walker (2 wheeled) Transfers: Sit to/from Stand Sit to Stand: Min guard         General transfer comment: VCs hand placement  Ambulation/Gait Ambulation/Gait assistance: Min guard Ambulation Distance (Feet): 50 Feet Assistive device: Rolling walker (2 wheeled) Gait Pattern/deviations: Step-to pattern   Gait velocity interpretation: Below normal speed for age/gender General Gait Details: steady with RW, no LOB, VCs for hand placement  Stairs            Wheelchair Mobility    Modified Rankin (Stroke Patients Only)       Balance Overall balance assessment: Modified Independent                                           Pertinent Vitals/Pain Pain Assessment: 0-10 Pain Score: 3  Pain Location: R knee Pain Descriptors / Indicators: Aching Pain Intervention(s): Limited activity within  patient's tolerance;Monitored during session;Premedicated before session;Ice applied    Home Living Family/patient expects to be discharged to:: Private residence Living Arrangements: Children;Spouse/significant other Available Help at Discharge: Family Type of Home: House Home Access: Stairs to enter Entrance Stairs-Rails: Left Entrance Stairs-Number of Steps: 5 Home Layout: One level Home Equipment: Environmental consultant - 2 wheels      Prior Function Level of Independence: Independent               Hand Dominance        Extremity/Trunk Assessment   Upper Extremity Assessment Upper Extremity Assessment: Overall WFL for tasks assessed    Lower Extremity Assessment Lower Extremity Assessment: RLE deficits/detail RLE Deficits / Details: 5-60* AAROM R knee, SLR 3/5    Cervical / Trunk Assessment Cervical / Trunk Assessment: Normal  Communication   Communication: No difficulties  Cognition Arousal/Alertness: Awake/alert Behavior During Therapy: WFL for tasks assessed/performed Overall Cognitive Status: Within Functional Limits for tasks assessed                                        General Comments      Exercises Total Joint Exercises Ankle Circles/Pumps: AROM;Both;10 reps;Supine Quad Sets: AROM;Both;5 reps;Supine Heel Slides: AAROM;Right;10 reps;Supine Straight Leg Raises: AROM;Right;5 reps;Supine Long Arc Quad: AROM;Right;5 reps;Seated Goniometric ROM: 5-60* AAROM R  knee   Assessment/Plan    PT Assessment Patient needs continued PT services  PT Problem List Pain;Decreased range of motion;Decreased strength;Decreased mobility;Decreased knowledge of use of DME       PT Treatment Interventions DME instruction;Gait training;Stair training;Functional mobility training;Balance training;Therapeutic exercise;Therapeutic activities;Patient/family education    PT Goals (Current goals can be found in the Care Plan section)  Acute Rehab PT Goals Patient  Stated Goal: to walk PT Goal Formulation: With patient/family Time For Goal Achievement: 12/12/16 Potential to Achieve Goals: Good    Frequency 7X/week   Barriers to discharge        Co-evaluation               AM-PAC PT "6 Clicks" Daily Activity  Outcome Measure Difficulty turning over in bed (including adjusting bedclothes, sheets and blankets)?: None Difficulty moving from lying on back to sitting on the side of the bed? : None Difficulty sitting down on and standing up from a chair with arms (e.g., wheelchair, bedside commode, etc,.)?: A Little Help needed moving to and from a bed to chair (including a wheelchair)?: A Little Help needed walking in hospital room?: A Little Help needed climbing 3-5 steps with a railing? : A Lot 6 Click Score: 19    End of Session Equipment Utilized During Treatment: Gait belt Activity Tolerance: Patient tolerated treatment well Patient left: in chair;with call bell/phone within reach;with family/visitor present;with chair alarm set Nurse Communication: Mobility status PT Visit Diagnosis: Muscle weakness (generalized) (M62.81);Difficulty in walking, not elsewhere classified (R26.2)    Time: 2035-5974 PT Time Calculation (min) (ACUTE ONLY): 38 min   Charges:   PT Evaluation $PT Eval Low Complexity: 1 Low PT Treatments $Gait Training: 8-22 mins $Therapeutic Exercise: 8-22 mins   PT G Codes:          Philomena Doheny 12/05/2016, 4:40 PM [phone]

## 2016-12-05 NOTE — Op Note (Signed)
NAMEBrita Wells                  ACCOUNT NO.:  [id number]  MEDICAL RECORD NO.:  [id number]  LOCATION:                                 FACILITY:  PHYSICIAN:  Pietro Cassis. Alvan Dame, M.D.       DATE OF BIRTH:  DATE OF PROCEDURE:  12/05/2016 DATE OF DISCHARGE:                              OPERATIVE REPORT   PREOPERATIVE DIAGNOSES: 1. Retained painful right great toe hardware. 2. Right knee osteoarthritis and pain.  POSTOPERATIVE DIAGNOSES: 1. Retained painful right great toe hardware. 2. Right knee osteoarthritis and pain.  PROCEDURES: 1. Removal of right great toe implant. 2. Right total knee arthroplasty.  Please note that the knee     replacement is recorded under separate templated operative note.  SURGEON:  Pietro Cassis. Alvan Dame, M.D.  ASSISTANT:  For the great toe removal, none.  For the right total knee replacement, Danae Orleans, PA.  Note that Mr. Guinevere Scarlet was present for the entirety of the case from preoperative position, perioperative management of the operative extremity, general facilitation of the case, and primary wound closure.  ANESTHESIA:  Preoperative regional plus spinal block anesthesia.  DRAINS:  None.  COMPLICATIONS:  None.  TOURNIQUET TIME:  29 minutes for the knee replacement only, not utilized for the toe.  PROCEDURE INDICATION OF THE REMOVAL OF HARDWARE PROCEDURE:  Ms. Rebekah Wells is a pleasant 76 year old female, who had presented for evaluation and management of her bilateral knee osteoarthritis and pain, right greater than left.  After recognizing that she had failed conservative measures, she was ready to proceed with total knee arthroplasty.  During the office visit in my evaluation and early conservative measure, she had mentioned that she had had previous right great toe fusion and was scheduled to see one of my partners to have the screw removed and it was painful.  I discussed with her at that time that during the knee replacement procedure that I  could relatively, easily, and predictably remove that screw without complicating her postoperative course.  She wished to include this to the procedure.  Reviewed the risks of some discomfort in the postoperative period.  This should be minimal risk complicating any surgery on her knee and consent was obtained for this procedure.  PROCEDURE IN DETAIL:  The patient was brought to the operative theater. Once adequate anesthesia, preoperative antibiotics had been administered as well as tranexamic acid and Decadron for her knee procedure.  The right lower extremity was prepped and draped in sterile fashion from the toes to the nonsterile tourniquet.  The right lower extremity was then prepped and draped in sterile fashion.  I marked out the skin incision for the knee and her toe.  Preoperatively, she identified none of the screw had been painful, but a small nodule just adjacent to this.  No tourniquet was utilized.  Following a time-out, identifying the planned procedures on the extremity and the patient, the incision was made from her previous incision.  Soft tissue dissection was carried down elevating to identify the screw head readily.  The screw head was removed with a 3.5 mm screw head.  I also identified this small little nodule and appeared  to be more bone in the fusion site, which I removed using a small rongeur.  There were no palpable defects or raised areas that could appear to cause her further problems.  At this point, I irrigated this wound with normal saline solution.  I then reapproximated skin edges using a 3-0 nylon.  We then dressed this at the end after cleaning it with Xeroform and a sterile wrap.  Once this was done, I prepped her foot out as we normally do with a sterile stockinette and Coban.  The remainder of the procedure was then the schedule total knee arthroplasty available in the chart under a templated operative note. Findings were reviewed with  family.     Pietro Cassis Alvan Dame, M.D.     MDO/MEDQ  D:  12/05/2016  T:  12/05/2016  Job:  [id number]

## 2016-12-05 NOTE — Op Note (Signed)
NAME:  [place] RECORD NO.:  [id number]                             FACILITY:  [facility]      PHYSICIAN:  Pietro Cassis. Alvan Dame, M.D.  DATE OF BIRTH:  1940/07/01      DATE OF PROCEDURE:  12/05/2016                                     OPERATIVE REPORT         PREOPERATIVE DIAGNOSIS: 1.  Right knee osteoarthritis. 2. Retained painful hardware right great toe     POSTOPERATIVE DIAGNOSIS: 1. Right knee osteoarthritis. 2. Retained painful hardware right great toe     FINDINGS:  The patient was noted to have complete loss of cartilage and   bone-on-bone arthritis with associated osteophytes in the lateral and patellofemoral compartments of   the knee with a significant synovitis and associated effusion.      PROCEDURE:  Right total knee replacement. 2. Removal deep implant right great toe (dictated under separate note)     COMPONENTS USED:  DePuy Attune rotating platform posterior stabilized knee   system, a size 4 femur, 4 tibia, size 7 mm PS AOX insert, and 35 anatomic patellar   button.      SURGEON:  Pietro Cassis. Alvan Dame, M.D.      ASSISTANT:  Danae Orleans, PA-C.      ANESTHESIA:  Regional and Spinal.      SPECIMENS:  None.      COMPLICATION:  None.      DRAINS:  None.  EBL: <100cc      TOURNIQUET TIME:   Total Tourniquet Time Documented: Thigh (Right) - 329 minutes Total: Thigh (Right) - 29 minutes  .      The patient was stable to the recovery room.      INDICATION FOR PROCEDURE:  KESHONNA VALVO is a 76 y.o. female patient of   mine.  The patient had been seen, evaluated, and treated conservatively in the   office with medication, activity modification, and injections.  The patient had   radiographic changes of bone-on-bone arthritis with endplate sclerosis and osteophytes noted.      The patient failed conservative measures including medication, injections, and activity modification, and at this point was ready for more definitive measures.   Based on the radiographic changes and failed conservative measures, the patient   decided to proceed with total knee replacement.  Risks of infection,   DVT, component failure, need for revision surgery, postop course, and   expectations were all   discussed and reviewed.  Consent was obtained for benefit of pain   relief.      PROCEDURE IN DETAIL:  The patient was brought to the operative theater.   Once adequate anesthesia, preoperative antibiotics, 2 gm of Ancef, 1 gm of Tranexamic Acid, and 10 mg of Decadron administered, the patient was positioned supine with the right thigh tourniquet placed.  The  right lower extremity was prepped and draped in sterile fashion.  A time-   out was performed identifying the patient, planned procedure, and   extremity.      The right lower extremity was placed in  the [facility] leg holder.  The leg was   exsanguinated, tourniquet elevated to 250 mmHg.  A midline incision was   made followed by median parapatellar arthrotomy.  Following initial   exposure, attention was first directed to the patella.  Precut   measurement was noted to be 24 mm.  I resected down to 14 mm and used a   35 anatomic patellar button to restore patellar height as well as cover the cut   surface.      The lug holes were drilled and a metal shim was placed to protect the   patella from retractors and saw blades.      At this point, attention was now directed to the femur.  The femoral   canal was opened with a drill, irrigated to try to prevent fat emboli.  An   intramedullary rod was passed at 3 degrees valgus, 8 mm of bone was   resected off the distal femur due to pre-operative hyperextension.  Following this resection, the tibia was   subluxated anteriorly.  Using the extramedullary guide, 2 mm of bone was resected off   the proximal lateral tibia.  We confirmed the gap would be   stable medially and laterally with a size 5-6 spacer block as well as confirmed   the cut was  perpendicular in the coronal plane, checking with an alignment rod.      Once this was done, I sized the femur to be a size 4 in the anterior-   posterior dimension, chose a standard component based on medial and   lateral dimension.  The size 4 rotation block was then pinned in   position anterior referenced using the C-clamp to set rotation.  The   anterior, posterior, and  chamfer cuts were made without difficulty nor   notching making certain that I was along the anterior cortex to help   with flexion gap stability.      The final box cut was made off the lateral aspect of distal femur.      At this point, the tibia was sized to be a size 4, the size 4 tray was   then pinned in position through the medial third of the tubercle,   drilled, and keel punched.  Trial reduction was now carried with a 4 femur,  4 tibia, a size 6 then 7 mm PS insert, and the 35 anatomic patella botton.  The knee was brought to   extension, full extension with good flexion stability with the patella   tracking through the trochlea without application of pressure.  Given   all these findings the femoral lug holes were drilled and then the trial components removed.  Final components were   opened and cement was mixed.  The knee was irrigated with normal saline   solution and pulse lavage.  The synovial lining was   then injected with 30 cc of 0.25% Marcaine with epinephrine and 1 cc of Toradol plus 30 cc of NS for a total of 61 cc.      The knee was irrigated.  Final implants were then cemented onto clean and   dried cut surfaces of bone with the knee brought to extension with a size 7 mm PS trial insert.      Once the cement had fully cured, the excess cement was removed   throughout the knee.  I confirmed I was satisfied with the range of   motion and stability, and the final  size 7 mm PS AOX insert was chosen.  It was   placed into the knee.      The tourniquet had been let down at 29 minutes.  No  significant   hemostasis required.  The   extensor mechanism was then reapproximated using #1 Vicryl and #0 Stratafix sutures with the knee   in flexion.  The   remaining wound was closed with 2-0 Vicryl and running 4-0 Monocryl.   The knee was cleaned, dried, dressed sterilely using Dermabond and   Aquacel dressing.  The patient was then   brought to recovery room in stable condition, tolerating the procedure   well.   Please note that Physician Assistant, Danae Orleans, PA-C, was present for the entirety of the case, and was utilized for pre-operative positioning, peri-operative retractor management, general facilitation of the procedure.  He was also utilized for primary wound closure at the end of the case.              Pietro Cassis Alvan Dame, M.D.    12/05/2016 8:44 AM

## 2016-12-05 NOTE — Transfer of Care (Signed)
Immediate Anesthesia Transfer of Care Note  Patient: Rebekah Wells  Procedure(s) Performed: RIGHT TOTAL KNEE ARTHROPLASTY (Right Knee) REMOVAL OF SCREW RIGHT GREAT TOE (Right Toe)  Patient Location: PACU  Anesthesia Type:Spinal  Level of Consciousness: awake, alert  and oriented  Airway & Oxygen Therapy: Patient Spontanous Breathing  Post-op Assessment: Report given to RN and Post -op Vital signs reviewed and stable  Post vital signs: Reviewed and stable  Last Vitals:  Vitals:   12/05/16 0659 12/05/16 0700  BP:    Pulse: 83 82  Resp: (!) 47 13  Temp:    SpO2: 97% 97%    Last Pain:  Vitals:   12/05/16 0542  TempSrc: Oral      Patients Stated Pain Goal: 4 (37/94/44 6190)  Complications: No apparent anesthesia complications

## 2016-12-05 NOTE — Anesthesia Procedure Notes (Signed)
Spinal  Patient location during procedure: OR Start time: 12/05/2016 7:21 AM Staffing Anesthesiologist: ODDONO, ERNEST Resident/CRNA: AUSTRIA, STEPHANIE C Performed: resident/CRNA  Preanesthetic Checklist Completed: patient identified, site marked, surgical consent, pre-op evaluation, timeout performed, IV checked, risks and benefits discussed and monitors and equipment checked Spinal Block Patient position: sitting Prep: Betadine Patient monitoring: heart rate, continuous pulse ox and blood pressure Location: L3-4 Injection technique: single-shot Needle Needle type: Pencan  Needle gauge: 24 G Needle length: 9 cm Assessment Sensory level: T6 Additional Notes Expiration date of kit checked and confirmed. Patient tolerated procedure well, without complications.       

## 2016-12-05 NOTE — Interval H&P Note (Signed)
History and Physical Interval Note:  12/05/2016 6:53 AM  Ceriah Meta Hatchet  has presented today for surgery, with the diagnosis of Right knee osteoarthritis  The various methods of treatment have been discussed with the patient and family. After consideration of risks, benefits and other options for treatment, the patient has consented to  Procedure(s) with comments: RIGHT TOTAL KNEE ARTHROPLASTY (Right) - 90 mins as a surgical intervention .  The patient's history has been reviewed, patient examined, no change in status, stable for surgery.  I have reviewed the patient's chart and labs.  Questions were answered to the patient's satisfaction.     Mauri Pole

## 2016-12-05 NOTE — Discharge Instructions (Signed)

## 2016-12-05 NOTE — Progress Notes (Signed)
AssistedDr. Oddono with right, ultrasound guided, adductor canal block. Side rails up, monitors on throughout procedure. See vital signs in flow sheet. Tolerated Procedure well.  

## 2016-12-05 NOTE — Progress Notes (Signed)
   12/05/16 1639  Acute Rehab PT Goals  Patient Stated Goal to walk  PT Goal Formulation With patient/family  Time For Goal Achievement 12/12/16  Potential to Achieve Goals Good  PT Time Calculation  PT Start Time (ACUTE ONLY) 1425  PT Stop Time (ACUTE ONLY) 1503  PT Time Calculation (min) (ACUTE ONLY) 38 min  PT G-Codes **NOT FOR INPATIENT CLASS**  Functional Assessment Tool Used AM-PAC 6 Clicks Basic Mobility  Functional Limitation Mobility: Walking and moving around  Mobility: Walking and Moving Around Current Status (B5597) CJ  Mobility: Walking and Moving Around Goal Status (C1638) CI  PT General Charges  $$ ACUTE PT VISIT 1 Visit  PT Evaluation  $PT Eval Low Complexity 1 Low  PT Treatments  $Gait Training 8-22 mins  $Therapeutic Exercise 8-22 mins  Blondell Reveal Kistler PT 12/05/2016  [phone]

## 2016-12-05 NOTE — Anesthesia Procedure Notes (Signed)
Anesthesia Regional Block: Adductor canal block   Pre-Anesthetic Checklist: ,, timeout performed, Correct Patient, Correct Site, Correct Laterality, Correct Procedure, Correct Position, site marked, Risks and benefits discussed,  Surgical consent,  Pre-op evaluation,  At surgeon's request and post-op pain management  Laterality: Right  Prep: chloraprep       Needles:  Injection technique: Single-shot  Needle Type: Echogenic Stimulator Needle     Needle Length: 5cm  Needle Gauge: 22     Additional Needles:   Procedures:, nerve stimulator,,, ultrasound used (permanent image in chart),,,,  Narrative:  Start time: 12/05/2016 6:52 AM End time: 12/05/2016 6:58 AM Injection made incrementally with aspirations every 5 mL.  Performed by: Personally  Anesthesiologist: Makaleigh Reinard  Additional Notes: Functioning IV was confirmed and monitors were applied.  A 68mm 22ga Arrow echogenic stimulator needle was used. Sterile prep and drape,hand hygiene and sterile gloves were used. Ultrasound guidance: relevant anatomy identified, needle position confirmed, local anesthetic spread visualized around nerve(s)., vascular puncture avoided.  Image printed for medical record. Negative aspiration and negative test dose prior to incremental administration of local anesthetic. The patient tolerated the procedure well.

## 2016-12-06 DIAGNOSIS — M170 Bilateral primary osteoarthritis of knee: Secondary | ICD-10-CM | POA: Diagnosis not present

## 2016-12-06 LAB — BASIC METABOLIC PANEL
Anion gap: 8 (ref 5–15)
BUN: 12 mg/dL (ref 6–20)
CHLORIDE: 107 mmol/L (ref 101–111)
CO2: 24 mmol/L (ref 22–32)
CREATININE: 0.64 mg/dL (ref 0.44–1.00)
Calcium: 8.1 mg/dL — ABNORMAL LOW (ref 8.9–10.3)
GFR calc non Af Amer: >60 mL/min (ref >60)
Glucose, Bld: 133 mg/dL — ABNORMAL HIGH (ref 65–99)
POTASSIUM: 4 mmol/L (ref 3.5–5.1)
SODIUM: 139 mmol/L (ref 135–145)

## 2016-12-06 LAB — CBC
HCT: 29.6 % — ABNORMAL LOW (ref 36.0–46.0)
HEMOGLOBIN: 9.6 g/dL — AB (ref 12.0–15.0)
MCH: 29.4 pg (ref 26.0–34.0)
MCHC: 32.4 g/dL (ref 30.0–36.0)
MCV: 90.5 fL (ref 78.0–100.0)
Platelets: 183 10*3/uL (ref 150–400)
RBC: 3.27 MIL/uL — AB (ref 3.87–5.11)
RDW: 12.3 % (ref 11.5–15.5)
WBC: 12.8 10*3/uL — AB (ref 4.0–10.5)

## 2016-12-06 NOTE — Progress Notes (Signed)
     Subjective: 1 Day Post-Op Procedure(s) (LRB): RIGHT TOTAL KNEE ARTHROPLASTY (Right) REMOVAL OF SCREW RIGHT GREAT TOE (Right)   Patient reports pain as mild, pain controlled. Dr. Alvan Dame discussed the foot being more limiting then the knee. Discussed changing the dressing on the foot in a couple of days.  Discussed findings of both the knee and toe.  Ready to be discharged home.   Objective:   VITALS:   Vitals:   12/06/16 0608 12/06/16 0907  BP: 100/63   Pulse: 78 75  Resp: 17 16  Temp: 98.1 F (36.7 C)   SpO2: 95% 95%    Dorsiflexion/Plantar flexion intact Incision: dressing C/D/I, both knee and foot No cellulitis present Compartment soft  LABS  Recent Labs  12/06/16 0600  HGB 9.6*  HCT 29.6*  WBC 12.8*  PLT 183     Recent Labs  12/06/16 0600  NA 139  K 4.0  BUN 12  CREATININE 0.64  GLUCOSE 133*     Assessment/Plan: 1 Day Post-Op Procedure(s) (LRB): RIGHT TOTAL KNEE ARTHROPLASTY (Right) REMOVAL OF SCREW RIGHT GREAT TOE (Right) Foley cath d/c'ed Advance diet Up with therapy Cast shoe ordered for right foot D/C IV fluids Discharge home    West Pugh. Christabel Camire   PAC  12/06/2016, 9:29 AM

## 2016-12-06 NOTE — Evaluation (Signed)
Occupational Therapy Evaluation Patient Details Name: WYNETTA SEITH MRN: [id number] DOB: [birth year] Today's Date: 12/06/2016    History of Present Illness 76 y.o. female admitted for R TKA and removal of hardware from R great toe. PMH of GERD, asthma.   Clinical Impression   Pt was admitted for the above sx. All education was completed. No further OT is needed at this time    Follow Up Recommendations  Supervision/Assistance - 24 hour    Equipment Recommendations  None recommended by OT    Recommendations for Other Services       Precautions / Restrictions Precautions Precautions: Knee Precaution Comments: reviewed no pillow under knee Restrictions Weight Bearing Restrictions: No Other Position/Activity Restrictions: WBAT      Mobility Bed Mobility Overal bed mobility: Modified Independent             General bed mobility comments: oob with PT  Transfers Overall transfer level: Needs assistance Equipment used: Rolling walker (2 wheeled) Transfers: Sit to/from Stand Sit to Stand: Min guard         General transfer comment: VCs hand placement    Balance Overall balance assessment: Modified Independent                                         ADL either performed or assessed with clinical judgement   ADL Overall ADL's : Needs assistance/impaired Eating/Feeding: Independent   Grooming: Wash/dry hands;Supervision/safety;Standing   Upper Body Bathing: Set up;Sitting   Lower Body Bathing: Minimal assistance;Sit to/from stand   Upper Body Dressing : Set up;Sitting   Lower Body Dressing: Moderate assistance;Sit to/from stand   Toilet Transfer: Min guard;Ambulation;BSC;RW   Toileting- Water quality scientist and Hygiene: Min guard;Sit to/from stand         General ADL Comments: Pt plans to discharge today. Ambulated to bathroom and performed toileting. Cues for safety with RW, to keep it around her body.  Husband to assist with adls.  Pt has a walk in shower at home but will stay at her daughter's x 2 weeks. Daughter has a tub shower combo.  Demonstrated putting 3:1 in shower stall facing out, along long wall of tub and cutting shower curtain liner vs readiness to step in.  She reports that she cannot get her foot wet for a week.  Talked about a single sturdy step stool to step backwards onto for boost into bed.       Vision         Perception     Praxis      Pertinent Vitals/Pain Pain Score: 5  Pain Location: R knee Pain Descriptors / Indicators: Aching Pain Intervention(s): Limited activity within patient's tolerance     Hand Dominance     Extremity/Trunk Assessment Upper Extremity Assessment Upper Extremity Assessment: Overall WFL for tasks assessed           Communication Communication Communication: No difficulties   Cognition Arousal/Alertness: Awake/alert Behavior During Therapy: WFL for tasks assessed/performed Overall Cognitive Status: Within Functional Limits for tasks assessed                                     General Comments       Exercises    Shoulder Instructions      Home Living Family/patient expects to be discharged to:: Private  residence Living Arrangements: Children;Spouse/significant other Available Help at Discharge: Family               Bathroom Shower/Tub: Teacher, early years/pre: [place]: Environmental consultant - 2 wheels;Bedside commode   Additional Comments: pt will stay at daughter's house for 2 weeks.  Daughter has tub and shower unit      Prior Functioning/Environment Level of Independence: Independent                 OT Problem List:        OT Treatment/Interventions:      OT Goals(Current goals can be found in the care plan section) Acute Rehab OT Goals Patient Stated Goal: to walk OT Goal Formulation: All assessment and education complete, DC therapy  OT Frequency:     Barriers to D/C:             Co-evaluation              AM-PAC PT "6 Clicks" Daily Activity     Outcome Measure Help from another person eating meals?: None Help from another person taking care of personal grooming?: A Little Help from another person toileting, which includes using toliet, bedpan, or urinal?: A Little Help from another person bathing (including washing, rinsing, drying)?: A Little Help from another person to put on and taking off regular upper body clothing?: A Little Help from another person to put on and taking off regular lower body clothing?: A Lot 6 Click Score: 18   End of Session    Activity Tolerance: Patient tolerated treatment well Patient left: in chair;with call bell/phone within reach;with family/visitor present  OT Visit Diagnosis: Pain Pain - Right/Left: Right Pain - part of body: Knee                Time: 5056-9794 OT Time Calculation (min): 16 min Charges:  OT General Charges $OT Visit: 1 Visit OT Evaluation $OT Eval Low Complexity: 1 Low G-Codes: OT G-codes **NOT FOR INPATIENT CLASS** Functional Assessment Tool Used: Clinical judgement;AM-PAC 6 Clicks Daily Activity Functional Limitation: Self care Self Care Current Status (I0165): At least 40 percent but less than 60 percent impaired, limited or restricted Self Care Goal Status (V3748): At least 40 percent but less than 60 percent impaired, limited or restricted Self Care Discharge Status [phone]): At least 40 percent but less than 60 percent impaired, limited or restricted   Lesle Chris, OTR/L [phone] 12/06/2016  Harmonie Verrastro 12/06/2016, 11:00 AM

## 2016-12-06 NOTE — Addendum Note (Signed)
Addendum  created 12/06/16 7902 by Lollie Sails, CRNA   Charge Capture section accepted, Visit diagnoses modified

## 2016-12-06 NOTE — Progress Notes (Signed)
Physical Therapy Treatment Patient Details Name: Rebekah Wells MRN: [id number] DOB: 05/03/1940 Today's Date: 12/06/2016    History of Present Illness 76 y.o. female admitted for R TKA and removal of hardware from R great toe. PMH of GERD, asthma.    PT Comments    Pt progressing well with mobility, she ambulated 110' with RW and performed TKA exercises with min assist. Will plan to do stair training this afternoon, then expect she'll be ready to DC home from PT standpoint.   Follow Up Recommendations  Outpatient PT     Equipment Recommendations  None recommended by PT    Recommendations for Other Services       Precautions / Restrictions Precautions Precautions: Knee Precaution Comments: reviewed no pillow under knee Restrictions Weight Bearing Restrictions: No Other Position/Activity Restrictions: WBAT    Mobility  Bed Mobility Overal bed mobility: Modified Independent             General bed mobility comments: with bedrail  Transfers Overall transfer level: Needs assistance Equipment used: Rolling walker (2 wheeled) Transfers: Sit to/from Stand Sit to Stand: Min guard         General transfer comment: VCs hand placement  Ambulation/Gait Ambulation/Gait assistance: Min guard Ambulation Distance (Feet): 110 Feet Assistive device: Rolling walker (2 wheeled) Gait Pattern/deviations: Step-through pattern;Decreased stride length;Antalgic   Gait velocity interpretation: Below normal speed for age/gender General Gait Details: steady with RW, no LOB   Stairs            Wheelchair Mobility    Modified Rankin (Stroke Patients Only)       Balance Overall balance assessment: Modified Independent                                          Cognition Arousal/Alertness: Awake/alert Behavior During Therapy: WFL for tasks assessed/performed Overall Cognitive Status: Within Functional Limits for tasks assessed                                        Exercises Total Joint Exercises Ankle Circles/Pumps: AROM;Both;10 reps;Supine Quad Sets: AROM;Both;5 reps;Supine Short Arc Quad: AROM;Right;10 reps;Supine Heel Slides: AAROM;Right;10 reps;Supine Hip ABduction/ADduction: AROM;Right;10 reps;Supine Straight Leg Raises: AROM;Right;Supine;10 reps Long Arc Quad: AROM;Right;Seated;10 reps Knee Flexion: AAROM;AROM;Right;10 reps;Seated Goniometric ROM: 5-85* AAROM R knee    General Comments        Pertinent Vitals/Pain Pain Score: 5  Pain Location: R knee Pain Descriptors / Indicators: Aching Pain Intervention(s): Limited activity within patient's tolerance;Monitored during session;Premedicated before session;Ice applied    Home Living                      Prior Function            PT Goals (current goals can now be found in the care plan section) Acute Rehab PT Goals Patient Stated Goal: to walk PT Goal Formulation: With patient/family Time For Goal Achievement: 12/12/16 Potential to Achieve Goals: Good Progress towards PT goals: Progressing toward goals    Frequency    7X/week      PT Plan Current plan remains appropriate    Co-evaluation              AM-PAC PT "6 Clicks" Daily Activity  Outcome Measure  Difficulty turning over in bed (  including adjusting bedclothes, sheets and blankets)?: None Difficulty moving from lying on back to sitting on the side of the bed? : None Difficulty sitting down on and standing up from a chair with arms (e.g., wheelchair, bedside commode, etc,.)?: A Little Help needed moving to and from a bed to chair (including a wheelchair)?: A Little Help needed walking in hospital room?: A Little Help needed climbing 3-5 steps with a railing? : A Lot 6 Click Score: 19    End of Session Equipment Utilized During Treatment: Gait belt Activity Tolerance: Patient tolerated treatment well Patient left: in chair;with call bell/phone within reach;with  family/visitor present Nurse Communication: Mobility status PT Visit Diagnosis: Muscle weakness (generalized) (M62.81);Difficulty in walking, not elsewhere classified (R26.2)     Time: 7517-0017 PT Time Calculation (min) (ACUTE ONLY): 33 min  Charges:  $Gait Training: 8-22 mins $Therapeutic Exercise: 8-22 mins                    G Codes:          Philomena Doheny 12/06/2016, 10:21 AM [phone]

## 2016-12-06 NOTE — Care Management Obs Status (Signed)
[facility] NOTIFICATION   Patient Details  Name: ALETHEA TERHAAR MRN: [id number] Date of Birth: [birth year]   Medicare Observation Status Notification Given:  Yes    Guadalupe Maple, RN 12/06/2016, 9:43 AM

## 2016-12-06 NOTE — Progress Notes (Signed)
Physical Therapy Treatment Patient Details Name: Rebekah Wells MRN: [id number] DOB: December 21, 1940 Today's Date: 12/06/2016    History of Present Illness 76 y.o. female admitted for R TKA and removal of hardware from R great toe. PMH of GERD, asthma.    PT Comments    Stair training completed, pt demonstrates understanding of HEP. PT goals met, from PT standpoint she is ready to DC home.   Follow Up Recommendations  Outpatient PT     Equipment Recommendations  None recommended by PT    Recommendations for Other Services       Precautions / Restrictions Precautions Precautions: Knee Precaution Comments: reviewed no pillow under knee Restrictions Weight Bearing Restrictions: No Other Position/Activity Restrictions: WBAT    Mobility  Bed Mobility Overal bed mobility: Modified Independent             General bed mobility comments: with bedrail  Transfers Overall transfer level: Needs assistance Equipment used: Rolling walker (2 wheeled) Transfers: Sit to/from Stand Sit to Stand: Supervision         General transfer comment: VCs hand placement  Ambulation/Gait Ambulation/Gait assistance: Supervision Ambulation Distance (Feet): 180 Feet Assistive device: Rolling walker (2 wheeled) Gait Pattern/deviations: Step-through pattern;Decreased stride length   Gait velocity interpretation: Below normal speed for age/gender General Gait Details: steady with RW, no LOB, VCs to lift head   Stairs Stairs: Yes   Stair Management: One rail Left;With cane;Forwards;Step to pattern Number of Stairs: 8 General stair comments: VCs for sequencing, pt's daughter and spouse present for stair training  Wheelchair Mobility    Modified Rankin (Stroke Patients Only)       Balance Overall balance assessment: Modified Independent                                          Cognition Arousal/Alertness: Awake/alert Behavior During Therapy: WFL for tasks  assessed/performed Overall Cognitive Status: Within Functional Limits for tasks assessed                                        Exercises Total Joint Exercises Ankle Circles/Pumps: AROM;Both;10 reps;Supine Quad Sets: AROM;Both;5 reps;Supine Short Arc Quad: AROM;Right;10 reps;Supine Heel Slides: AAROM;Right;10 reps;Supine Hip ABduction/ADduction: AROM;Right;10 reps;Supine Straight Leg Raises: AROM;Right;Supine;10 reps Long Arc Quad: AROM;Right;Seated;10 reps Knee Flexion: AAROM;AROM;Right;10 reps;Seated Goniometric ROM: 5-90* AAROM    General Comments        Pertinent Vitals/Pain Pain Score: 5  Pain Location: R knee Pain Descriptors / Indicators: Aching Pain Intervention(s): Limited activity within patient's tolerance;Monitored during session;Premedicated before session (pt declined ice, she is about to DC)    Home Living Family/patient expects to be discharged to:: Private residence Living Arrangements: Children;Spouse/significant other Available Help at Discharge: Family         Home Equipment: Gilford Rile - 2 wheels;Bedside commode Additional Comments: pt will stay at daughter's house for 2 weeks.  Daughter has tub and shower unit    Prior Function Level of Independence: Independent          PT Goals (current goals can now be found in the care plan section) Acute Rehab PT Goals Patient Stated Goal: to walk PT Goal Formulation: With patient/family Time For Goal Achievement: 12/12/16 Potential to Achieve Goals: Good Progress towards PT goals: Progressing toward goals    Frequency  7X/week      PT Plan Current plan remains appropriate    Co-evaluation              AM-PAC PT "6 Clicks" Daily Activity  Outcome Measure  Difficulty turning over in bed (including adjusting bedclothes, sheets and blankets)?: None Difficulty moving from lying on back to sitting on the side of the bed? : None Difficulty sitting down on and standing up from  a chair with arms (e.g., wheelchair, bedside commode, etc,.)?: A Little Help needed moving to and from a bed to chair (including a wheelchair)?: None Help needed walking in hospital room?: None Help needed climbing 3-5 steps with a railing? : A Little 6 Click Score: 22    End of Session Equipment Utilized During Treatment: Gait belt Activity Tolerance: Patient tolerated treatment well Patient left: in chair;with call bell/phone within reach;with family/visitor present Nurse Communication: Mobility status PT Visit Diagnosis: Muscle weakness (generalized) (M62.81);Difficulty in walking, not elsewhere classified (R26.2)     Time: 9935-7017 PT Time Calculation (min) (ACUTE ONLY): 24 min  Charges:  $Gait Training: 8-22 mins $Therapeutic Exercise: 8-22 mins                    G Codes:          Blondell Reveal Kistler 12/06/2016, 2:08 PM [phone]

## 2016-12-12 NOTE — Discharge Summary (Signed)
Physician Discharge Summary  Patient ID: MINOLA GUIN MRN: [id number] DOB/AGE: Jun 27, 1940 76 y.o.  Admit date: 12/05/2016 Discharge date: 12/06/2016   Procedures:  Procedure(s) (LRB): RIGHT TOTAL KNEE ARTHROPLASTY (Right) REMOVAL OF SCREW RIGHT GREAT TOE (Right)  Attending Physician:  Dr. Paralee Cancel   Admission Diagnoses:   Right knee primary OA / pain  2.  Right great toe with retained hardware  Discharge Diagnoses:  Principal Problem:   S/P right TKA Active Problems:   S/P total knee replacement  Past Medical History:  Diagnosis Date  . Arthritis    OA  . Asthma   . Dysrhythmia    tachycardia  . Gastritis   . GERD (gastroesophageal reflux disease)   . H/O seasonal allergies   . Hyperlipemia     HPI:    Rebekah Wells, 76 y.o. female, has a history of pain and functional disability in the right knee due to arthritis and has failed non-surgical conservative treatments for greater than 12 weeks to includeNSAID's and/or analgesics, corticosteriod injections and activity modification.  Onset of symptoms was gradual, starting 2+ years ago with gradually worsening course since that time. The patient noted prior procedures on the knee to include  arthroscopy on the right knee(s).  Patient currently rates pain in the right knee(s) at 7 out of 10 with activity. Patient has night pain, worsening of pain with activity and weight bearing, pain that interferes with activities of daily living, pain with passive range of motion, crepitus and joint swelling.  Patient has evidence of periarticular osteophytes and joint space narrowing by imaging studies. Retained hardware in the right great toe, which is painful.  Patient has had this for some time, but just recently it has started to become painful.  Discussion was had to remove the hardware to help reduce her pain in the toe. There is no active infection.   Risks, benefits and expectations were discussed with the patient.  Risks including  but not limited to the risk of anesthesia, blood clots, nerve damage, blood vessel damage, failure of the prosthesis, infection and up to and including death.  Patient understand the risks, benefits and expectations and wishes to proceed with surgery.   PCP: Lonna Cobb, MD   Discharged Condition: good  Hospital Course:  Patient underwent the above stated procedure on 12/05/2016. Patient tolerated the procedure well and brought to the recovery room in good condition and subsequently to the floor.  POD #1 BP: 100/63 ; Pulse: 75 ; Temp: 98.1 F (36.7 C) ; Resp: 16 Patient reports pain as mild, pain controlled. Dr. Alvan Dame discussed the foot being more limiting then the knee. Discussed changing the dressing on the foot in a couple of days.  Discussed findings of both the knee and toe.  Ready to be discharged home.  Dorsiflexion/plantar flexion intact, incision: dressing C/D/I (both knee and foot), no cellulitis present and compartment soft.   LABS  Basename    HGB     9.6  HCT     29.6    Discharge Exam: General appearance: alert, cooperative and no distress Extremities: Homans sign is negative, no sign of DVT, no edema, redness or tenderness in the calves or thighs and no ulcers, gangrene or trophic changes  Disposition: Home with follow up in 2 weeks   Follow-up Information    Paralee Cancel, MD. Schedule an appointment as soon as possible for a visit in 2 week(s).   Specialty:  Orthopedic Surgery Contact information: [address]  [place] [address] [phone]           Discharge Instructions    Call MD / Call 911    Complete by:  As directed    If you experience chest pain or shortness of breath, CALL 911 and be transported to the hospital emergency room.  If you develope a fever above 101 F, pus (white drainage) or increased drainage or redness at the wound, or calf pain, call your surgeon's office.   Change dressing    Complete by:  As directed     Maintain surgical dressing until follow up in the clinic. If the edges start to pull up, may reinforce with tape. If the dressing is no longer working, may remove and cover with gauze and tape, but must keep the area dry and clean.  Call with any questions or concerns.   Constipation Prevention    Complete by:  As directed    Drink plenty of fluids.  Prune juice may be helpful.  You may use a stool softener, such as Colace (over the counter) 100 mg twice a day.  Use MiraLax (over the counter) for constipation as needed.   Diet - low sodium heart healthy    Complete by:  As directed    Discharge instructions    Complete by:  As directed    Maintain surgical dressing until follow up in the clinic. If the edges start to pull up, may reinforce with tape. If the dressing is no longer working, may remove and cover with gauze and tape, but must keep the area dry and clean.  Follow up in 2 weeks at [facility]. Call with any questions or concerns.  Dressing on right foot an be removed in 2 days, and replaced as discussed.   Increase activity slowly as tolerated    Complete by:  As directed    Weight bearing as tolerated with assist device (walker, cane, etc) as directed, use it as long as suggested by your surgeon or therapist, typically at least 4-6 weeks.   TED hose    Complete by:  As directed    Use stockings (TED hose) for 2 weeks on both leg(s).  You may remove them at night for sleeping.      Allergies as of 12/06/2016      Reactions   Penicillins Rash   Has patient had a PCN reaction causing immediate rash, facial/tongue/throat swelling, SOB or lightheadedness with hypotension: Yes Has patient had a PCN reaction causing severe rash involving mucus membranes or skin necrosis: Yes Has patient had a PCN reaction that required hospitalization: No - already in hospital  Has patient had a PCN reaction occurring within the last 10 years: No If all of the above answers are "NO", then  may proceed with Cephalosporin use.   Sulfa Antibiotics Rash      Medication List    STOP taking these medications   influenza vac recom quadrivalent 0.5 ML injection Commonly known as:  FLUBLOK   meloxicam 15 MG tablet Commonly known as:  MOBIC   traMADol 50 MG tablet Commonly known as:  ULTRAM     TAKE these medications   albuterol 108 (90 Base) MCG/ACT inhaler Commonly known as:  PROVENTIL HFA;VENTOLIN HFA Inhale into the lungs every 6 (six) hours as needed for wheezing or shortness of breath.   aspirin 81 MG chewable tablet Commonly known as:  ASPIRIN CHILDRENS Chew 1 tablet (81 mg total) by mouth 2 (  two) times daily. Take for 4 weeks.   beta carotene w/minerals tablet Take 1 tablet by mouth daily.   calcium carbonate 600 MG Tabs tablet Commonly known as:  OS-CAL Take 600 mg by mouth daily.   cetirizine 10 MG tablet Commonly known as:  ZYRTEC Take 10 mg by mouth daily as needed for allergies.   docusate sodium 100 MG capsule Commonly known as:  COLACE Take 1 capsule (100 mg total) by mouth 2 (two) times daily.   ferrous sulfate 325 (65 FE) MG tablet Commonly known as:  FERROUSUL Take 1 tablet (325 mg total) by mouth 3 (three) times daily with meals.   Fluticasone-Salmeterol 100-50 MCG/DOSE Aepb Commonly known as:  ADVAIR Inhale 1 puff into the lungs daily.   HYDROcodone-acetaminophen 7.5-325 MG tablet Commonly known as:  NORCO Take 1-2 tablets by mouth every 4 (four) hours as needed for moderate pain or severe pain.   methocarbamol 500 MG tablet Commonly known as:  ROBAXIN Take 1 tablet (500 mg total) by mouth every 6 (six) hours as needed for muscle spasms.   metoprolol succinate 25 MG 24 hr tablet Commonly known as:  TOPROL-XL Take 25 mg by mouth daily.   omeprazole 20 MG capsule Commonly known as:  PRILOSEC Take 20 mg by mouth daily as needed (for acid reflex).   polyethylene glycol packet Commonly known as:  MIRALAX / GLYCOLAX Take 17 g by  mouth 2 (two) times daily.   simvastatin 20 MG tablet Commonly known as:  ZOCOR Take 20 mg by mouth daily.   traZODone 50 MG tablet Commonly known as:  DESYREL Take 25 mg by mouth at bedtime as needed for sleep.   VITAMIN B 12 PO Take 1 tablet by mouth daily.            Discharge Care Instructions        Start     Ordered   12/06/16 0000  Change dressing    Comments:  Maintain surgical dressing until follow up in the clinic. If the edges start to pull up, may reinforce with tape. If the dressing is no longer working, may remove and cover with gauze and tape, but must keep the area dry and clean.  Call with any questions or concerns.   12/06/16 9937       Signed: West Pugh. Makayleigh Poliquin   PA-C  12/12/2016, 10:07 AM

## 2017-02-20 HISTORY — PX: BREAST LUMPECTOMY: SHX2

## 2017-03-01 NOTE — Patient Instructions (Addendum)
Rebekah Wells  03/01/2017   Your procedure is scheduled on: 03-20-17   Report to [facility] Main  Entrance Report to Admitting at 7:35 AM   Call this number if you have problems the morning of surgery [phone]   Remember: Do not eat food or drink liquids :After Midnight.     Take these medicines the morning of surgery with A SIP OF WATER: Metoprolol Succinate (Toprol-XL). You may also bring and use your inhaler.                                You may not have any metal on your body including hair pins and              piercings  Do not wear jewelry, make-up, lotions, powders or perfumes, deodorant              Donot shave 48 hrs prior to the procedure.   Do not bring valuables to the hospital. [place].  Contacts, dentures or bridgework may not be worn into surgery.  Leave suitcase in the car. After surgery it may be brought to your room.                Please read over the following fact sheets you were given: _____________________________________________________________________             [facility] - Preparing for Surgery Before surgery, you can play an important role.  Because skin is not sterile, your skin needs to be as free of germs as possible.  You can reduce the number of germs on your skin by washing with CHG (chlorahexidine gluconate) soap before surgery.  CHG is an antiseptic cleaner which kills germs and bonds with the skin to continue killing germs even after washing. Please DO NOT use if you have an allergy to CHG or antibacterial soaps.  If your skin becomes reddened/irritated stop using the CHG and inform your nurse when you arrive at Short Stay. Do not shave (including legs and underarms) for at least 48 hours prior to the first CHG shower.  You may shave your face/neck. Please follow these instructions carefully:  1.  Shower with CHG Soap the night before surgery and the  morning of  Surgery.  2.  If you choose to wash your hair, wash your hair first as usual with your  normal  shampoo.  3.  After you shampoo, rinse your hair and body thoroughly to remove the  shampoo.                           4.  Use CHG as you would any other liquid soap.  You can apply chg directly  to the skin and wash                       Gently with a scrungie or clean washcloth.  5.  Apply the CHG Soap to your body ONLY FROM THE NECK DOWN.   Do not use on face/ open                           Wound  or open sores. Avoid contact with eyes, ears mouth and genitals (private parts).                       Wash face,  Genitals (private parts) with your normal soap.             6.  Wash thoroughly, paying special attention to the area where your surgery  will be performed.  7.  Thoroughly rinse your body with warm water from the neck down.  8.  DO NOT shower/wash with your normal soap after using and rinsing off  the CHG Soap.                9.  Pat yourself dry with a clean towel.            10.  Wear clean pajamas.            11.  Place clean sheets on your bed the night of your first shower and do not  sleep with pets. Day of Surgery : Do not apply any lotions/deodorants the morning of surgery.  Please wear clean clothes to the hospital/surgery center.  FAILURE TO FOLLOW THESE INSTRUCTIONS MAY RESULT IN THE CANCELLATION OF YOUR SURGERY PATIENT SIGNATURE_________________________________  NURSE SIGNATURE__________________________________  ________________________________________________________________________   Adam Phenix  An incentive spirometer is a tool that can help keep your lungs clear and active. This tool measures how well you are filling your lungs with each breath. Taking long deep breaths may help reverse or decrease the chance of developing breathing (pulmonary) problems (especially infection) following:  A long period of time when you are unable to move or be active. BEFORE  THE PROCEDURE   If the spirometer includes an indicator to show your best effort, your nurse or respiratory therapist will set it to a desired goal.  If possible, sit up straight or lean slightly forward. Try not to slouch.  Hold the incentive spirometer in an upright position. INSTRUCTIONS FOR USE  1. Sit on the edge of your bed if possible, or sit up as far as you can in bed or on a chair. 2. Hold the incentive spirometer in an upright position. 3. Breathe out normally. 4. Place the mouthpiece in your mouth and seal your lips tightly around it. 5. Breathe in slowly and as deeply as possible, raising the piston or the ball toward the top of the column. 6. Hold your breath for 3-5 seconds or for as long as possible. Allow the piston or ball to fall to the bottom of the column. 7. Remove the mouthpiece from your mouth and breathe out normally. 8. Rest for a few seconds and repeat Steps 1 through 7 at least 10 times every 1-2 hours when you are awake. Take your time and take a few normal breaths between deep breaths. 9. The spirometer may include an indicator to show your best effort. Use the indicator as a goal to work toward during each repetition. 10. After each set of 10 deep breaths, practice coughing to be sure your lungs are clear. If you have an incision (the cut made at the time of surgery), support your incision when coughing by placing a pillow or rolled up towels firmly against it. Once you are able to get out of bed, walk around indoors and cough well. You may stop using the incentive spirometer when instructed by your caregiver.  RISKS AND COMPLICATIONS  Take your time so you do not get dizzy  or light-headed.  If you are in pain, you may need to take or ask for pain medication before doing incentive spirometry. It is harder to take a deep breath if you are having pain. AFTER USE  Rest and breathe slowly and easily.  It can be helpful to keep track of a log of your progress.  Your caregiver can provide you with a simple table to help with this. If you are using the spirometer at home, follow these instructions: [place] IF:   You are having difficultly using the spirometer.  You have trouble using the spirometer as often as instructed.  Your pain medication is not giving enough relief while using the spirometer.  You develop fever of 100.5 F (38.1 C) or higher. SEEK IMMEDIATE MEDICAL CARE IF:   You cough up bloody sputum that had not been present before.  You develop fever of 102 F (38.9 C) or greater.  You develop worsening pain at or near the incision site. MAKE SURE YOU:   Understand these instructions.  Will watch your condition.  Will get help right away if you are not doing well or get worse. Document Released: 06/19/2006 Document Revised: 05/01/2011 Document Reviewed: 08/20/2006 ExitCare Patient Information 2014 ExitCare, [place].   ________________________________________________________________________  WHAT IS A BLOOD TRANSFUSION? Blood Transfusion Information  A transfusion is the replacement of blood or some of its parts. Blood is made up of multiple cells which provide different functions.  Red blood cells carry oxygen and are used for blood loss replacement.  White blood cells fight against infection.  Platelets control bleeding.  Plasma helps clot blood.  Other blood products are available for specialized needs, such as hemophilia or other clotting disorders. BEFORE THE TRANSFUSION  Who gives blood for transfusions?   Healthy volunteers who are fully evaluated to make sure their blood is safe. This is blood bank blood. Transfusion therapy is the safest it has ever been in the practice of medicine. Before blood is taken from a donor, a complete history is taken to make sure that person has no history of diseases nor engages in risky social behavior (examples are intravenous drug use or sexual activity with multiple  partners). The donor's travel history is screened to minimize risk of transmitting infections, such as malaria. The donated blood is tested for signs of infectious diseases, such as HIV and hepatitis. The blood is then tested to be sure it is compatible with you in order to minimize the chance of a transfusion reaction. If you or a relative donates blood, this is often done in anticipation of surgery and is not appropriate for emergency situations. It takes many days to process the donated blood. RISKS AND COMPLICATIONS Although transfusion therapy is very safe and saves many lives, the main dangers of transfusion include:   Getting an infectious disease.  Developing a transfusion reaction. This is an allergic reaction to something in the blood you were given. Every precaution is taken to prevent this. The decision to have a blood transfusion has been considered carefully by your caregiver before blood is given. Blood is not given unless the benefits outweigh the risks. AFTER THE TRANSFUSION  Right after receiving a blood transfusion, you will usually feel much better and more energetic. This is especially true if your red blood cells have gotten low (anemic). The transfusion raises the level of the red blood cells which carry oxygen, and this usually causes an energy increase.  The nurse administering the transfusion will monitor  you carefully for complications. HOME CARE INSTRUCTIONS  No special instructions are needed after a transfusion. You may find your energy is better. Speak with your caregiver about any limitations on activity for underlying diseases you may have. SEEK MEDICAL CARE IF:   Your condition is not improving after your transfusion.  You develop redness or irritation at the intravenous (IV) site. SEEK IMMEDIATE MEDICAL CARE IF:  Any of the following symptoms occur over the next 12 hours:  Shaking chills.  You have a temperature by mouth above 102 F (38.9 C), not  controlled by medicine.  Chest, back, or muscle pain.  People around you feel you are not acting correctly or are confused.  Shortness of breath or difficulty breathing.  Dizziness and fainting.  You get a rash or develop hives.  You have a decrease in urine output.  Your urine turns a dark color or changes to pink, red, or brown. Any of the following symptoms occur over the next 10 days:  You have a temperature by mouth above 102 F (38.9 C), not controlled by medicine.  Shortness of breath.  Weakness after normal activity.  The white part of the eye turns yellow (jaundice).  You have a decrease in the amount of urine or are urinating less often.  Your urine turns a dark color or changes to pink, red, or brown. Document Released: 02/04/2000 Document Revised: 05/01/2011 Document Reviewed: 09/23/2007 [facility] Patient Information 2014 [place], [place].  _______________________________________________________________________

## 2017-03-02 ENCOUNTER — Encounter (INDEPENDENT_AMBULATORY_CARE_PROVIDER_SITE_OTHER): Payer: Self-pay

## 2017-03-02 ENCOUNTER — Encounter (HOSPITAL_COMMUNITY)
Admission: RE | Admit: 2017-03-02 | Discharge: 2017-03-02 | Disposition: A | Discharge status: Home / Self Care | Payer: Medicare Other | Source: Ambulatory Visit | Attending: Orthopedic Surgery | Admitting: Orthopedic Surgery

## 2017-03-02 ENCOUNTER — Encounter (HOSPITAL_COMMUNITY): Payer: Self-pay

## 2017-03-02 ENCOUNTER — Other Ambulatory Visit: Payer: Self-pay

## 2017-03-02 DIAGNOSIS — Z01812 Encounter for preprocedural laboratory examination: Secondary | ICD-10-CM | POA: Diagnosis present

## 2017-03-02 DIAGNOSIS — M1712 Unilateral primary osteoarthritis, left knee: Secondary | ICD-10-CM | POA: Insufficient documentation

## 2017-03-02 LAB — BASIC METABOLIC PANEL
Anion gap: 6 (ref 5–15)
BUN: 12 mg/dL (ref 6–20)
CHLORIDE: 109 mmol/L (ref 101–111)
CO2: 24 mmol/L (ref 22–32)
Calcium: 9.2 mg/dL (ref 8.9–10.3)
Creatinine, Ser: 0.54 mg/dL (ref 0.44–1.00)
GFR calc Af Amer: >60 mL/min (ref >60)
GLUCOSE: 95 mg/dL (ref 65–99)
POTASSIUM: 4 mmol/L (ref 3.5–5.1)
Sodium: 139 mmol/L (ref 135–145)

## 2017-03-02 LAB — CBC
HEMATOCRIT: 42.1 % (ref 36.0–46.0)
HEMOGLOBIN: 12.9 g/dL (ref 12.0–15.0)
MCH: 28.2 pg (ref 26.0–34.0)
MCHC: 30.6 g/dL (ref 30.0–36.0)
MCV: 91.9 fL (ref 78.0–100.0)
Platelets: 279 10*3/uL (ref 150–400)
RBC: 4.58 MIL/uL (ref 3.87–5.11)
RDW: 12.3 % (ref 11.5–15.5)
WBC: 8.2 10*3/uL (ref 4.0–10.5)

## 2017-03-02 LAB — SURGICAL PCR SCREEN
MRSA, PCR: NEGATIVE
Staphylococcus aureus: NEGATIVE

## 2017-03-02 NOTE — H&P (Signed)
TOTAL KNEE ADMISSION H&P  Patient is being admitted for left total knee arthroplasty.  Subjective:  Chief Complaint:    Left knee primary OA / pain  HPI: Rebekah Wells, 77 y.o. female, has a history of pain and functional disability in the left knee due to arthritis and has failed non-surgical conservative treatments for greater than 12 weeks to include NSAID's and/or analgesics, corticosteriod injections and activity modification.  Onset of symptoms was gradual, starting  years ago with gradually worsening course since that time. The patient noted prior procedures on the knee to include  arthroscopy on the left knee.  Patient currently rates pain in the left knee(s) at 4 out of 10 with activity. Patient has worsening of pain with activity and weight bearing, pain that interferes with activities of daily living, pain with passive range of motion, crepitus and joint swelling.  Patient has evidence of periarticular osteophytes and joint space narrowing by imaging studies.  There is no active infection.  Risks, benefits and expectations were discussed with the patient.  Risks including but not limited to the risk of anesthesia, blood clots, nerve damage, blood vessel damage, failure of the prosthesis, infection and up to and including death.  Patient understand the risks, benefits and expectations and wishes to proceed with surgery.   PCP: Lonna Cobb, MD  D/C Plans:       Home   Post-op Meds:       No Rx given   Tranexamic Acid:      To be given - IV   Decadron:      Is to be given  FYI:     ASA  Norco  DME:     Pt already has equipment  PT:     OPPT Rx given   Patient Active Problem List   Diagnosis Date Noted  . 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 Arthritis    OA  . Asthma   . Dysrhythmia    tachycardia  . Gastritis   . GERD (gastroesophageal reflux disease)   . H/O seasonal allergies   .  Hyperlipemia     Past Surgical History:  Procedure Laterality Date  . ABDOMINAL HYSTERECTOMY    . CARPAL TUNNEL RELEASE Left 07/09/2014   Procedure: LEFT CARPAL TUNNEL RELEASE;  Surgeon: Daryll Brod, MD;  Location: [place];  Service: Orthopedics;  Laterality: Left;  . CESAREAN SECTION    . CHOLECYSTECTOMY    . DG TOES RT FOOT MULTIPLE SPECIF ([place] HX) Right   . DIAGNOSTIC LAPAROSCOPY    . DIGIT NAIL REMOVAL Right 12/05/2016   Procedure: REMOVAL OF SCREW RIGHT GREAT TOE;  Surgeon: Paralee Cancel, MD;  Location: [facility];  Service: Orthopedics;  Laterality: Right;  . EYE SURGERY     bilateral cataract with lens implants  . HAND ARTHROPLASTY Right    for OA  . KNEE ARTHROSCOPY     x3  . NISSEN FUNDOPLICATION    . TONSILLECTOMY    . TOTAL KNEE ARTHROPLASTY Right 12/05/2016   Procedure: RIGHT TOTAL KNEE ARTHROPLASTY;  Surgeon: Paralee Cancel, MD;  Location: [facility];  Service: Orthopedics;  Laterality: Right;  90 mins    No current facility-administered medications for this encounter.    Current Outpatient Medications  Medication Sig Dispense Refill Last Dose  . albuterol (PROVENTIL HFA;VENTOLIN HFA) 108 (90 BASE) MCG/ACT inhaler Inhale 1-2 puffs into the lungs  every 6 (six) hours as needed for wheezing or shortness of breath.    12/04/2016 at Unknown time  . beta carotene w/minerals (OCUVITE) tablet Take 1 tablet by mouth daily.   12/04/2016 at Unknown time  . calcium carbonate (OS-CAL) 600 MG TABS tablet Take 600 mg by mouth daily.    12/04/2016 at Unknown time  . cetirizine (ZYRTEC) 10 MG tablet Take 10 mg by mouth daily as needed for allergies.    12/03/2016  . Fluticasone-Salmeterol (ADVAIR) 100-50 MCG/DOSE AEPB Inhale 1 puff into the lungs daily.   12/05/2016 at 0400  . meloxicam (MOBIC) 15 MG tablet Take 15 mg by mouth daily as needed for pain.     . metoprolol succinate (TOPROL-XL) 25 MG 24 hr tablet Take 25 mg by mouth daily.   12/04/2016 at 2000  . Multiple  Vitamins-Minerals (MULTIVITAMIN PO) Take 1 tablet by mouth daily.     Marland Kitchen omeprazole (PRILOSEC) 20 MG capsule Take 20 mg by mouth daily as needed (for acid reflex).    12/05/2016 at 0400  . simvastatin (ZOCOR) 40 MG tablet Take 40 mg by mouth every evening.    12/04/2016 at Unknown time  . traZODone (DESYREL) 50 MG tablet Take 25 mg by mouth at bedtime as needed for sleep.   Past Month at Unknown time  . docusate sodium (COLACE) 100 MG capsule Take 1 capsule (100 mg total) by mouth 2 (two) times daily. (Patient not taking: Reported on 02/28/2017) 10 capsule 0 Not Taking at Unknown time  . ferrous sulfate (FERROUSUL) 325 (65 FE) MG tablet Take 1 tablet (325 mg total) by mouth 3 (three) times daily with meals. (Patient not taking: Reported on 02/28/2017)  3 Not Taking at Unknown time  . HYDROcodone-acetaminophen (NORCO) 7.5-325 MG tablet Take 1-2 tablets by mouth every 4 (four) hours as needed for moderate pain or severe pain. (Patient not taking: Reported on 02/28/2017) 60 tablet 0 Completed Course at Unknown time  . methocarbamol (ROBAXIN) 500 MG tablet Take 1 tablet (500 mg total) by mouth every 6 (six) hours as needed for muscle spasms. (Patient not taking: Reported on 02/28/2017) 40 tablet 0 Completed Course at Unknown time  . polyethylene glycol (MIRALAX / GLYCOLAX) packet Take 17 g by mouth 2 (two) times daily. (Patient not taking: Reported on 02/28/2017) 14 each 0 Not Taking at Unknown time   Allergies  Allergen Reactions  . Penicillins Rash and Other (See Comments)    Has patient had a PCN reaction causing immediate rash, facial/tongue/throat swelling, SOB or lightheadedness with hypotension: Yes Has patient had a PCN reaction causing severe rash involving mucus membranes or skin necrosis: Yes Has patient had a PCN reaction that required hospitalization: No - already in hospital  Has patient had a PCN reaction occurring within the last 10 years: No If all of the above answers are "NO", then may proceed  with Cephalosporin use.   . Sulfa Antibiotics Rash    Social History   Tobacco Use  . Smoking status: Never Smoker  . Smokeless tobacco: Never Used  Substance Use Topics  . Alcohol use: No    Family History  Problem Relation Age of Onset  . Colon cancer Mother   . CAD Father 43  . Lung cancer Sister      Review of Systems  Constitutional: Negative.   HENT: Negative.   Eyes: Negative.   Respiratory: Negative.   Cardiovascular: Negative.   Gastrointestinal: Positive for heartburn.  Genitourinary: Negative.   Musculoskeletal:  Positive for joint pain.  Skin: Negative.   Neurological: Negative.   Endo/Heme/Allergies: Positive for environmental allergies.  Psychiatric/Behavioral: Negative.     Objective:  Physical Exam  Constitutional: She is oriented to person, place, and time. She appears well-developed.  HENT:  Head: Normocephalic.  Mouth/Throat: She has dentures.  Eyes: Pupils are equal, round, and reactive to light.  Neck: Neck supple. No JVD present. No tracheal deviation present. No thyromegaly present.  Cardiovascular: Normal rate, regular rhythm and intact distal pulses.  Respiratory: Effort normal and breath sounds normal. No respiratory distress. She has no wheezes.  GI: Soft. There is no tenderness. There is no guarding.  Musculoskeletal:       Left knee: She exhibits decreased range of motion, swelling and bony tenderness. She exhibits no ecchymosis, no deformity, no laceration and no erythema. Tenderness found.  Lymphadenopathy:    She has no cervical adenopathy.  Neurological: She is alert and oriented to person, place, and time.  Skin: Skin is warm and dry.  Psychiatric: She has a normal mood and affect.     Labs:  Estimated body mass index is 26.57 kg/m as calculated from the following:   Height as of 12/05/16: 5\' 3"  (1.6 m).   Weight as of 12/05/16: 68 kg (150 lb).   Imaging Review Plain radiographs demonstrate severe degenerative joint  disease of the left knee(s).  The bone quality appears to be good for age and reported activity level.  Assessment/Plan:  End stage arthritis, left knee   The patient history, physical examination, clinical judgment of the provider and imaging studies are consistent with end stage degenerative joint disease of the left knee(s) and total knee arthroplasty is deemed medically necessary. The treatment options including medical management, injection therapy arthroscopy and arthroplasty were discussed at length. The risks and benefits of total knee arthroplasty were presented and reviewed. The risks due to aseptic loosening, infection, stiffness, patella tracking problems, thromboembolic complications and other imponderables were discussed. The patient acknowledged the explanation, agreed to proceed with the plan and consent was signed. Patient is being admitted for inpatient treatment for surgery, pain control, PT, OT, prophylactic antibiotics, VTE prophylaxis, progressive ambulation and ADL's and discharge planning. The patient is planning to be discharged home.     [place] P[place] Sh[place] R[place]   PA-C  03/02/2017, 9:57 AM

## 2017-03-20 ENCOUNTER — Inpatient Hospital Stay (HOSPITAL_COMMUNITY)
Admission: RE | Admit: 2017-03-20 | Discharge: 2017-03-22 | DRG: 470 | Disposition: A | Discharge status: Home / Self Care | Payer: Medicare Other | Source: Ambulatory Visit | Attending: Orthopedic Surgery | Admitting: Orthopedic Surgery

## 2017-03-20 ENCOUNTER — Encounter (HOSPITAL_COMMUNITY)
Admission: RE | Disposition: A | Discharge status: Home / Self Care | Payer: Self-pay | Source: Ambulatory Visit | Attending: Orthopedic Surgery

## 2017-03-20 ENCOUNTER — Other Ambulatory Visit: Payer: Self-pay

## 2017-03-20 ENCOUNTER — Encounter (HOSPITAL_COMMUNITY): Payer: Self-pay | Admitting: Emergency Medicine

## 2017-03-20 ENCOUNTER — Inpatient Hospital Stay (HOSPITAL_COMMUNITY): Payer: Medicare Other | Admitting: Registered Nurse

## 2017-03-20 DIAGNOSIS — Z96698 Presence of other orthopedic joint implants: Secondary | ICD-10-CM | POA: Diagnosis present

## 2017-03-20 DIAGNOSIS — E785 Hyperlipidemia, unspecified: Secondary | ICD-10-CM | POA: Diagnosis present

## 2017-03-20 DIAGNOSIS — Z880 Allergy status to penicillin: Secondary | ICD-10-CM

## 2017-03-20 DIAGNOSIS — E663 Overweight: Secondary | ICD-10-CM | POA: Diagnosis present

## 2017-03-20 DIAGNOSIS — Z882 Allergy status to sulfonamides status: Secondary | ICD-10-CM

## 2017-03-20 DIAGNOSIS — Z96652 Presence of left artificial knee joint: Secondary | ICD-10-CM

## 2017-03-20 DIAGNOSIS — K219 Gastro-esophageal reflux disease without esophagitis: Secondary | ICD-10-CM | POA: Diagnosis present

## 2017-03-20 DIAGNOSIS — Z96651 Presence of right artificial knee joint: Secondary | ICD-10-CM | POA: Diagnosis present

## 2017-03-20 DIAGNOSIS — Z96659 Presence of unspecified artificial knee joint: Secondary | ICD-10-CM

## 2017-03-20 DIAGNOSIS — Z6825 Body mass index (BMI) 25.0-25.9, adult: Secondary | ICD-10-CM

## 2017-03-20 DIAGNOSIS — M1712 Unilateral primary osteoarthritis, left knee: Principal | ICD-10-CM | POA: Diagnosis present

## 2017-03-20 DIAGNOSIS — Z7951 Long term (current) use of inhaled steroids: Secondary | ICD-10-CM

## 2017-03-20 DIAGNOSIS — J45909 Unspecified asthma, uncomplicated: Secondary | ICD-10-CM | POA: Diagnosis present

## 2017-03-20 HISTORY — PX: TOTAL KNEE ARTHROPLASTY: SHX125

## 2017-03-20 LAB — TYPE AND SCREEN
ABO/RH(D): B POS
ANTIBODY SCREEN: NEGATIVE

## 2017-03-20 SURGERY — ARTHROPLASTY, KNEE, TOTAL
Anesthesia: Spinal | Site: Knee | Laterality: Left

## 2017-03-20 MED ORDER — SODIUM CHLORIDE 0.9 % IJ SOLN
INTRAMUSCULAR | Status: DC | PRN
  Administered 2017-03-20: 30 mL

## 2017-03-20 MED ORDER — DIPHENHYDRAMINE HCL 12.5 MG/5ML PO ELIX: 12.5000 mg | ORAL_SOLUTION | ORAL | Status: DC | PRN

## 2017-03-20 MED ORDER — CEFAZOLIN SODIUM-DEXTROSE 2-4 GM/100ML-% IV SOLN
2.0000 g | Freq: Four times a day (QID) | INTRAVENOUS | Status: AC
  Administered 2017-03-20 (×2): 2 g via INTRAVENOUS
  Filled 2017-03-20 (×2): qty 100

## 2017-03-20 MED ORDER — BUPIVACAINE IN DEXTROSE 0.75-8.25 % IT SOLN
INTRATHECAL | Status: DC | PRN
  Administered 2017-03-20: 1.6 mL via INTRATHECAL

## 2017-03-20 MED ORDER — FENTANYL CITRATE (PF) 100 MCG/2ML IJ SOLN
100.0000 ug | Freq: Once | INTRAMUSCULAR | Status: AC
  Administered 2017-03-20: 50 ug via INTRAVENOUS
  Filled 2017-03-20: qty 2

## 2017-03-20 MED ORDER — METOPROLOL SUCCINATE ER 25 MG PO TB24
25.0000 mg | ORAL_TABLET | Freq: Every day | ORAL | Status: DC
  Administered 2017-03-22: 25 mg via ORAL
  Filled 2017-03-20 (×2): qty 1

## 2017-03-20 MED ORDER — HYDROCODONE-ACETAMINOPHEN 7.5-325 MG PO TABS
1.0000 | ORAL_TABLET | ORAL | Status: DC | PRN
  Administered 2017-03-20 – 2017-03-21 (×4): 1 via ORAL
  Filled 2017-03-20 (×5): qty 1

## 2017-03-20 MED ORDER — ASPIRIN 81 MG PO CHEW
81.0000 mg | CHEWABLE_TABLET | Freq: Two times a day (BID) | ORAL | Status: DC
  Administered 2017-03-20 – 2017-03-22 (×4): 81 mg via ORAL
  Filled 2017-03-20 (×4): qty 1

## 2017-03-20 MED ORDER — ONDANSETRON HCL 4 MG PO TABS
4.0000 mg | ORAL_TABLET | Freq: Three times a day (TID) | ORAL | Status: DC | PRN
Start: 2017-03-20 — End: 2017-03-22
  Administered 2017-03-20: 4 mg via ORAL
  Filled 2017-03-20: qty 1

## 2017-03-20 MED ORDER — HYDROMORPHONE HCL 1 MG/ML IJ SOLN
0.5000 mg | INTRAMUSCULAR | Status: DC | PRN
  Administered 2017-03-20 (×2): 1 mg via INTRAVENOUS
  Filled 2017-03-20 (×3): qty 1

## 2017-03-20 MED ORDER — ROPIVACAINE HCL 7.5 MG/ML IJ SOLN
INTRAMUSCULAR | Status: DC | PRN
  Administered 2017-03-20: 20 mL via PERINEURAL

## 2017-03-20 MED ORDER — CEFAZOLIN SODIUM-DEXTROSE 2-4 GM/100ML-% IV SOLN
2.0000 g | INTRAVENOUS | Status: AC
  Administered 2017-03-20: 2 g via INTRAVENOUS
  Filled 2017-03-20: qty 100

## 2017-03-20 MED ORDER — ASPIRIN 81 MG PO CHEW: 81.0000 mg | CHEWABLE_TABLET | Freq: Two times a day (BID) | ORAL | 0 refills | Status: AC

## 2017-03-20 MED ORDER — BISACODYL 10 MG RE SUPP: 10.0000 mg | Freq: Every day | RECTAL | Status: DC | PRN

## 2017-03-20 MED ORDER — LORATADINE 10 MG PO TABS
10.0000 mg | ORAL_TABLET | Freq: Every day | ORAL | Status: DC
  Administered 2017-03-21 – 2017-03-22 (×2): 10 mg via ORAL
  Filled 2017-03-20 (×2): qty 1

## 2017-03-20 MED ORDER — SODIUM CHLORIDE 0.9 % IR SOLN
Status: DC | PRN
  Administered 2017-03-20: 1000 mL

## 2017-03-20 MED ORDER — MIDAZOLAM HCL 2 MG/2ML IJ SOLN
2.0000 mg | Freq: Once | INTRAMUSCULAR | Status: AC
  Administered 2017-03-20: 1 mg via INTRAVENOUS
  Filled 2017-03-20: qty 2

## 2017-03-20 MED ORDER — KETOROLAC TROMETHAMINE 30 MG/ML IJ SOLN
INTRAMUSCULAR | Status: AC
  Filled 2017-03-20: qty 1

## 2017-03-20 MED ORDER — PROPOFOL 500 MG/50ML IV EMUL
INTRAVENOUS | Status: DC | PRN
  Administered 2017-03-20: 40 ug/kg/min via INTRAVENOUS

## 2017-03-20 MED ORDER — HYDROCODONE-ACETAMINOPHEN 7.5-325 MG PO TABS
2.0000 | ORAL_TABLET | ORAL | Status: DC | PRN
  Administered 2017-03-21 – 2017-03-22 (×7): 2 via ORAL
  Filled 2017-03-20 (×7): qty 2

## 2017-03-20 MED ORDER — LACTATED RINGERS IV SOLN
INTRAVENOUS | Status: DC
  Administered 2017-03-20: 08:00:00 via INTRAVENOUS

## 2017-03-20 MED ORDER — LIDOCAINE 2% (20 MG/ML) 5 ML SYRINGE
INTRAMUSCULAR | Status: AC
  Filled 2017-03-20: qty 5

## 2017-03-20 MED ORDER — DEXAMETHASONE SODIUM PHOSPHATE 10 MG/ML IJ SOLN
10.0000 mg | Freq: Once | INTRAMUSCULAR | Status: AC
  Administered 2017-03-21: 10 mg via INTRAVENOUS
  Filled 2017-03-20: qty 1

## 2017-03-20 MED ORDER — METHOCARBAMOL 500 MG PO TABS
500.0000 mg | ORAL_TABLET | Freq: Four times a day (QID) | ORAL | Status: DC | PRN
  Administered 2017-03-20 – 2017-03-22 (×5): 500 mg via ORAL
  Filled 2017-03-20 (×5): qty 1

## 2017-03-20 MED ORDER — PANTOPRAZOLE SODIUM 40 MG PO TBEC: 40.0000 mg | DELAYED_RELEASE_TABLET | Freq: Every day | ORAL | Status: DC | PRN

## 2017-03-20 MED ORDER — FERROUS SULFATE 325 (65 FE) MG PO TABS: 325.0000 mg | ORAL_TABLET | Freq: Three times a day (TID) | ORAL | 3 refills | Status: DC

## 2017-03-20 MED ORDER — ONDANSETRON HCL 4 MG/2ML IJ SOLN
INTRAMUSCULAR | Status: AC
  Filled 2017-03-20: qty 2

## 2017-03-20 MED ORDER — TRAZODONE HCL 50 MG PO TABS: 25.0000 mg | ORAL_TABLET | Freq: Every evening | ORAL | Status: DC | PRN

## 2017-03-20 MED ORDER — BUPIVACAINE HCL (PF) 0.25 % IJ SOLN
INTRAMUSCULAR | Status: DC | PRN
  Administered 2017-03-20: 30 mL

## 2017-03-20 MED ORDER — DEXAMETHASONE SODIUM PHOSPHATE 10 MG/ML IJ SOLN
10.0000 mg | Freq: Once | INTRAMUSCULAR | Status: AC
  Administered 2017-03-20: 10 mg via INTRAVENOUS

## 2017-03-20 MED ORDER — DEXAMETHASONE SODIUM PHOSPHATE 10 MG/ML IJ SOLN
INTRAMUSCULAR | Status: AC
  Filled 2017-03-20: qty 1

## 2017-03-20 MED ORDER — EPHEDRINE SULFATE-NACL 50-0.9 MG/10ML-% IV SOSY
PREFILLED_SYRINGE | INTRAVENOUS | Status: DC | PRN
  Administered 2017-03-20: 5 mg via INTRAVENOUS

## 2017-03-20 MED ORDER — DOCUSATE SODIUM 100 MG PO CAPS: 100.0000 mg | ORAL_CAPSULE | Freq: Two times a day (BID) | ORAL | 0 refills | Status: DC

## 2017-03-20 MED ORDER — ACETAMINOPHEN 325 MG PO TABS: 650.0000 mg | ORAL_TABLET | ORAL | Status: DC | PRN

## 2017-03-20 MED ORDER — LIDOCAINE 2% (20 MG/ML) 5 ML SYRINGE
INTRAMUSCULAR | Status: DC | PRN
  Administered 2017-03-20: 100 mg via INTRAVENOUS

## 2017-03-20 MED ORDER — POLYETHYLENE GLYCOL 3350 17 G PO PACK: 17.0000 g | PACK | Freq: Two times a day (BID) | ORAL | 0 refills | Status: DC

## 2017-03-20 MED ORDER — TRANEXAMIC ACID 1000 MG/10ML IV SOLN
1000.0000 mg | Freq: Once | INTRAVENOUS | Status: AC
  Administered 2017-03-20: 1000 mg via INTRAVENOUS
  Filled 2017-03-20: qty 1100

## 2017-03-20 MED ORDER — CHLORHEXIDINE GLUCONATE 4 % EX LIQD: 60.0000 mL | Freq: Once | CUTANEOUS | Status: DC

## 2017-03-20 MED ORDER — VANCOMYCIN HCL IN DEXTROSE 1-5 GM/200ML-% IV SOLN: 1000.0000 mg | Freq: Two times a day (BID) | INTRAVENOUS | Status: DC

## 2017-03-20 MED ORDER — MOMETASONE FURO-FORMOTEROL FUM 100-5 MCG/ACT IN AERO
2.0000 | INHALATION_SPRAY | Freq: Two times a day (BID) | RESPIRATORY_TRACT | Status: DC
Start: 2017-03-20 — End: 2017-03-22
  Administered 2017-03-20 – 2017-03-22 (×3): 2 via RESPIRATORY_TRACT
  Filled 2017-03-20: qty 8.8

## 2017-03-20 MED ORDER — TRANEXAMIC ACID 1000 MG/10ML IV SOLN
1000.0000 mg | INTRAVENOUS | Status: AC
  Administered 2017-03-20: 1000 mg via INTRAVENOUS
  Filled 2017-03-20: qty 1100

## 2017-03-20 MED ORDER — FLUTICASONE FUROATE-VILANTEROL 100-25 MCG/INH IN AEPB
1.0000 | INHALATION_SPRAY | Freq: Every day | RESPIRATORY_TRACT | Status: DC
  Administered 2017-03-22: 1 via RESPIRATORY_TRACT
  Filled 2017-03-20: qty 28

## 2017-03-20 MED ORDER — PROMETHAZINE HCL 25 MG/ML IJ SOLN: 6.2500 mg | INTRAMUSCULAR | Status: DC | PRN

## 2017-03-20 MED ORDER — HYDROMORPHONE HCL 1 MG/ML IJ SOLN: 0.2500 mg | INTRAMUSCULAR | Status: DC | PRN

## 2017-03-20 MED ORDER — METHOCARBAMOL 1000 MG/10ML IJ SOLN
500.0000 mg | Freq: Four times a day (QID) | INTRAVENOUS | Status: DC | PRN
  Filled 2017-03-20: qty 5

## 2017-03-20 MED ORDER — ALUM & MAG HYDROXIDE-SIMETH 200-200-20 MG/5ML PO SUSP: 15.0000 mL | ORAL | Status: DC | PRN

## 2017-03-20 MED ORDER — PROPOFOL 10 MG/ML IV BOLUS
INTRAVENOUS | Status: AC
  Filled 2017-03-20: qty 40

## 2017-03-20 MED ORDER — ONDANSETRON HCL 4 MG/2ML IJ SOLN
INTRAMUSCULAR | Status: DC | PRN
  Administered 2017-03-20: 4 mg via INTRAVENOUS

## 2017-03-20 MED ORDER — SODIUM CHLORIDE 0.9 % IV SOLN
INTRAVENOUS | Status: DC
  Administered 2017-03-20 – 2017-03-21 (×2): via INTRAVENOUS

## 2017-03-20 MED ORDER — BUPIVACAINE HCL (PF) 0.25 % IJ SOLN
INTRAMUSCULAR | Status: AC
  Filled 2017-03-20: qty 30

## 2017-03-20 MED ORDER — FERROUS SULFATE 325 (65 FE) MG PO TABS
325.0000 mg | ORAL_TABLET | Freq: Three times a day (TID) | ORAL | Status: DC
  Administered 2017-03-21 – 2017-03-22 (×3): 325 mg via ORAL
  Filled 2017-03-20 (×3): qty 1

## 2017-03-20 MED ORDER — MAGNESIUM CITRATE PO SOLN: 1.0000 | Freq: Once | ORAL | Status: DC | PRN

## 2017-03-20 MED ORDER — PHENOL 1.4 % MT LIQD
1.0000 | OROMUCOSAL | Status: DC | PRN
  Filled 2017-03-20: qty 177

## 2017-03-20 MED ORDER — ACETAMINOPHEN 650 MG RE SUPP: 650.0000 mg | RECTAL | Status: DC | PRN

## 2017-03-20 MED ORDER — METOCLOPRAMIDE HCL 5 MG/ML IJ SOLN
5.0000 mg | Freq: Three times a day (TID) | INTRAMUSCULAR | Status: DC | PRN
  Administered 2017-03-20: 10 mg via INTRAVENOUS
  Filled 2017-03-20: qty 2

## 2017-03-20 MED ORDER — SODIUM CHLORIDE 0.9 % IJ SOLN
INTRAMUSCULAR | Status: AC
  Filled 2017-03-20: qty 50

## 2017-03-20 MED ORDER — DOCUSATE SODIUM 100 MG PO CAPS
100.0000 mg | ORAL_CAPSULE | Freq: Two times a day (BID) | ORAL | Status: DC
  Administered 2017-03-20 – 2017-03-22 (×4): 100 mg via ORAL
  Filled 2017-03-20 (×4): qty 1

## 2017-03-20 MED ORDER — METHOCARBAMOL 500 MG PO TABS: 500.0000 mg | ORAL_TABLET | Freq: Four times a day (QID) | ORAL | 0 refills | Status: DC | PRN

## 2017-03-20 MED ORDER — 0.9 % SODIUM CHLORIDE (POUR BTL) OPTIME
TOPICAL | Status: DC | PRN
  Administered 2017-03-20: 1000 mL

## 2017-03-20 MED ORDER — POLYETHYLENE GLYCOL 3350 17 G PO PACK
17.0000 g | PACK | Freq: Two times a day (BID) | ORAL | Status: DC
  Administered 2017-03-20 – 2017-03-22 (×4): 17 g via ORAL
  Filled 2017-03-20 (×4): qty 1

## 2017-03-20 MED ORDER — KETOROLAC TROMETHAMINE 30 MG/ML IJ SOLN
INTRAMUSCULAR | Status: DC | PRN
  Administered 2017-03-20: 30 mg via INTRAMUSCULAR

## 2017-03-20 MED ORDER — MENTHOL 3 MG MT LOZG: 1.0000 | LOZENGE | OROMUCOSAL | Status: DC | PRN

## 2017-03-20 MED ORDER — METOCLOPRAMIDE HCL 5 MG PO TABS: 5.0000 mg | ORAL_TABLET | Freq: Three times a day (TID) | ORAL | Status: DC | PRN

## 2017-03-20 MED ORDER — ONDANSETRON HCL 4 MG/2ML IJ SOLN: 4.0000 mg | Freq: Three times a day (TID) | INTRAMUSCULAR | Status: DC | PRN

## 2017-03-20 MED ORDER — HYDROCODONE-ACETAMINOPHEN 7.5-325 MG PO TABS: 1.0000 | ORAL_TABLET | ORAL | 0 refills | Status: DC | PRN

## 2017-03-20 MED ORDER — PROPOFOL 10 MG/ML IV BOLUS
INTRAVENOUS | Status: AC
  Filled 2017-03-20: qty 20

## 2017-03-20 MED ORDER — ALBUTEROL SULFATE (2.5 MG/3ML) 0.083% IN NEBU: 3.0000 mL | INHALATION_SOLUTION | Freq: Four times a day (QID) | RESPIRATORY_TRACT | Status: DC | PRN

## 2017-03-20 MED ORDER — SIMVASTATIN 20 MG PO TABS
40.0000 mg | ORAL_TABLET | Freq: Every evening | ORAL | Status: DC
  Administered 2017-03-20: 40 mg via ORAL
  Filled 2017-03-20: qty 2

## 2017-03-20 SURGICAL SUPPLY — 49 items
BAG DECANTER FOR FLEXI CONT (MISCELLANEOUS) IMPLANT
BAG ZIPLOCK 12X15 (MISCELLANEOUS) ×2 IMPLANT
BANDAGE ACE 6X5 VEL STRL LF (GAUZE/BANDAGES/DRESSINGS) ×2 IMPLANT
BLADE SAW SGTL 11.0X1.19X90.0M (BLADE) IMPLANT
BLADE SAW SGTL 13.0X1.19X90.0M (BLADE) ×2 IMPLANT
BOWL SMART MIX CTS (DISPOSABLE) ×2 IMPLANT
CAPT KNEE TOTAL 3 ATTUNE ×2 IMPLANT
CEMENT HV SMART SET (Cement) ×4 IMPLANT
COVER SURGICAL LIGHT HANDLE (MISCELLANEOUS) ×2 IMPLANT
CUFF TOURN SGL QUICK 34 (TOURNIQUET CUFF) ×1
CUFF TRNQT CYL 34X4X40X1 (TOURNIQUET CUFF) ×1 IMPLANT
DECANTER SPIKE VIAL GLASS SM (MISCELLANEOUS) ×2 IMPLANT
DERMABOND ADVANCED (GAUZE/BANDAGES/DRESSINGS) ×1
DERMABOND ADVANCED .7 DNX12 (GAUZE/BANDAGES/DRESSINGS) ×1 IMPLANT
DRAPE U-SHAPE 47X51 STRL (DRAPES) ×2 IMPLANT
DRESSING AQUACEL AG SP 3.5X10 (GAUZE/BANDAGES/DRESSINGS) ×1 IMPLANT
DRSG AQUACEL AG SP 3.5X10 (GAUZE/BANDAGES/DRESSINGS) ×2
DURAPREP 26ML APPLICATOR (WOUND CARE) ×4 IMPLANT
ELECT REM PT RETURN 15FT ADLT (MISCELLANEOUS) ×2 IMPLANT
GLOVE BIOGEL M 7.0 STRL (GLOVE) IMPLANT
GLOVE BIOGEL PI IND STRL 7.5 (GLOVE) ×1 IMPLANT
GLOVE BIOGEL PI IND STRL 8.5 (GLOVE) ×1 IMPLANT
GLOVE BIOGEL PI INDICATOR 7.5 (GLOVE) ×1
GLOVE BIOGEL PI INDICATOR 8.5 (GLOVE) ×1
GLOVE ECLIPSE 8.0 STRL XLNG CF (GLOVE) ×2 IMPLANT
GLOVE ORTHO TXT STRL SZ7.5 (GLOVE) ×4 IMPLANT
GOWN STRL REUS W/TWL LRG LVL3 (GOWN DISPOSABLE) ×2 IMPLANT
GOWN STRL REUS W/TWL XL LVL3 (GOWN DISPOSABLE) ×2 IMPLANT
HANDPIECE INTERPULSE COAX TIP (DISPOSABLE) ×1
HOLDER FOLEY CATH W/STRAP (MISCELLANEOUS) ×2 IMPLANT
MANIFOLD NEPTUNE II (INSTRUMENTS) ×2 IMPLANT
PACK TOTAL KNEE CUSTOM (KITS) ×2 IMPLANT
POSITIONER SURGICAL ARM (MISCELLANEOUS) ×2 IMPLANT
SET HNDPC FAN SPRY TIP SCT (DISPOSABLE) ×1 IMPLANT
SET PAD KNEE POSITIONER (MISCELLANEOUS) ×2 IMPLANT
SUT MNCRL AB 4-0 PS2 18 (SUTURE) ×2 IMPLANT
SUT STRATAFIX 1PDS 45CM VIOLET (SUTURE) IMPLANT
SUT STRATAFIX PDS+ 0 24IN (SUTURE) ×2 IMPLANT
SUT STRATAFIX SPIRAL PDS+ 70CM (SUTURE)
SUT VIC AB 1 CT1 36 (SUTURE) ×2 IMPLANT
SUT VIC AB 2-0 CT1 27 (SUTURE) ×3
SUT VIC AB 2-0 CT1 TAPERPNT 27 (SUTURE) ×3 IMPLANT
SUTURE STRATFX SPIRL PDS+ 70CM (SUTURE) IMPLANT
SYR 50ML LL SCALE MARK (SYRINGE) ×2 IMPLANT
TRAY FOLEY CATH SILVER 14FR (SET/KITS/TRAYS/PACK) ×2 IMPLANT
TRAY FOLEY W/METER SILVER 16FR (SET/KITS/TRAYS/PACK) IMPLANT
WATER STERILE IRR 1000ML POUR (IV SOLUTION) ×2 IMPLANT
WRAP KNEE MAXI GEL POST OP (GAUZE/BANDAGES/DRESSINGS) ×2 IMPLANT
YANKAUER SUCT BULB TIP 10FT TU (MISCELLANEOUS) ×2 IMPLANT

## 2017-03-20 NOTE — Interval H&P Note (Signed)
History and Physical Interval Note:  03/20/2017 8:50 AM  Rebekah Wells  has presented today for surgery, with the diagnosis of Left knee osteoarthritis  The various methods of treatment have been discussed with the patient and family. After consideration of risks, benefits and other options for treatment, the patient has consented to  Procedure(s) with comments: LEFT TOTAL KNEE ARTHROPLASTY (Left) - 70 mins as a surgical intervention .  The patient's history has been reviewed, patient examined, no change in status, stable for surgery.  I have reviewed the patient's chart and labs.  Questions were answered to the patient's satisfaction.     Mauri Pole

## 2017-03-20 NOTE — Evaluation (Signed)
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 h/o GERD and asthma. Had to work a lot with OPPT for [place] .   Clinical Impression  Pt is s/p TKA resulting in the deficits listed below (see PT Problem List). Pt will benefit from PT to increase her independence and safety with mobility to allow discharge home. Pt is concerned about achieving and keeping full extension ROM in her knee as she had trouble with in her RTKA. Reviewed and educated on how to help prevent this and work with her home exercise program and position.       Follow Up Recommendations Outpatient PT(note pt recieving OPPT on Monday, would like to be prepared to do exercises prior to her OP session )    Equipment Recommendations  None recommended by PT    Recommendations for Other Services       Precautions / Restrictions Precautions Precautions: Knee Precaution Comments: discussed kne pillow under knee, reviewed sleeping and resting with it in extension as well . **May need KI for sleeping to avoid flexion while sleeping.  Restrictions Weight Bearing Restrictions: No      Mobility  Bed Mobility Overal bed mobility: Modified Independent                Transfers Overall transfer level: Needs assistance Equipment used: Rolling walker (2 wheeled) Transfers: Sit to/from Stand Sit to Stand: Min guard         General transfer comment: cues for placement of LLE during transfers and hand placement on RW for safety   Ambulation/Gait Ambulation/Gait assistance: Min assist Ambulation Distance (Feet): 45 Feet Assistive device: Rolling walker (2 wheeled) Gait Pattern/deviations: Step-to pattern     General Gait Details: limited pattern to step to , however then patient was doing step through at the end of walk.   Stairs             Wheelchair Mobility    Modified Rankin (Stroke Patients Only)       Balance                                             Pertinent Vitals/Pain Pain Assessment: 0-10 Pain Score: 3  Pain Location: L knee posterior due to patietn really trying to work on extension  Pain Descriptors / Indicators: Aching;Burning Pain Intervention(s): Monitored during session;Ice applied;Patient requesting pain meds-RN notified    Home Living Family/patient expects to be discharged to:: Private residence(Pt lives in [place], but will be at her local daughter's house for 2 weeks. Will recieve OPPT here anc then in [place] after she returns home ) Living Arrangements: Spouse/significant other Available Help at Discharge: Family Type of Home: House Home Access: Stairs to enter Entrance Stairs-Rails: Left Entrance Stairs-Number of Steps: 4-5 Home Layout: One level Home Equipment: [place] - 2 wheels;Bedside commode Additional Comments: pt will stay at daughter's house for 2 weeks.  Daughter has tub and shower unit    Prior Function Level of Independence: Independent               Hand Dominance        Extremity/Trunk Assessment        Lower Extremity Assessment Lower Extremity Assessment: Overall WFL for  tasks assessed;LLE deficits/detail LLE Deficits / Details: grossly 5-50 for knee ROM supine , worked and educated on extesnion resting positions with log blanket roll to lateral aspect of knee to prevent ER , and then curled under achilles section of foot for knee extension stretch        Communication   Communication: No difficulties  Cognition Arousal/Alertness: Awake/alert Behavior During Therapy: WFL for tasks assessed/performed Overall Cognitive Status: Within Functional Limits for tasks assessed                                        General Comments      Exercises Total Joint Exercises Ankle Circles/Pumps: AROM;Both;10 reps;Supine Quad  Sets: AROM;Both;10 reps;Supine Heel Slides: AAROM;Left;Supine;10 reps Straight Leg Raises: AAROM;Supine;10 reps Goniometric ROM: 5-50( worked and educated on extesnion resting positions with log blanket roll to lateral aspect of knee to prevent ER , and then curled under achilles section of foot for knee extension stretch )   Assessment/Plan    PT Assessment Patient needs continued PT services  PT Problem List Decreased strength;Decreased range of motion;Decreased activity tolerance;Decreased mobility       PT Treatment Interventions Gait training;Stair training;Functional mobility training;Therapeutic activities;Patient/family education;Therapeutic exercise    PT Goals (Current goals can be found in the Care Plan section)  Acute Rehab PT Goals Patient Stated Goal: I want to get this one better so I can be active again and not worry about my knee tightening up again.  PT Goal Formulation: With patient Time For Goal Achievement: 03/27/17    Frequency 7X/week   Barriers to discharge        Co-evaluation               AM-PAC PT "6 Clicks" Daily Activity  Outcome Measure Difficulty turning over in bed (including adjusting bedclothes, sheets and blankets)?: None Difficulty moving from lying on back to sitting on the side of the bed? : None Difficulty sitting down on and standing up from a chair with arms (e.g., wheelchair, bedside commode, etc,.)?: A Little Help needed moving to and from a bed to chair (including a wheelchair)?: A Little Help needed walking in hospital room?: A Little Help needed climbing 3-5 steps with a railing? : A Little 6 Click Score: 20    End of Session Equipment Utilized During Treatment: Gait belt Activity Tolerance: Patient tolerated treatment well Patient left: in chair;with call bell/phone within reach;with chair alarm set Nurse Communication: Mobility status;Patient requests pain meds PT Visit Diagnosis: Other abnormalities of gait and  mobility (R26.89)    Time: 6578-4696 PT Time Calculation (min) (ACUTE ONLY): 40 min   Charges:   PT Evaluation $PT Eval Low Complexity: 1 Low PT Treatments $Gait Training: 8-22 mins   PT G Codes:        Clide Dales, PT Pager: [phone] 03/20/2017   Zia Kanner, Gatha Mayer 03/20/2017, 7:22 PM

## 2017-03-20 NOTE — Anesthesia Procedure Notes (Signed)
Spinal  Start time: 03/20/2017 10:28 AM End time: 03/20/2017 10:33 AM Staffing Resident/CRNA: Armistead, Lacey A, CRNA Preanesthetic Checklist Completed: patient identified, site marked, surgical consent, pre-op evaluation, timeout performed, IV checked, risks and benefits discussed and monitors and equipment checked Spinal Block Patient position: sitting Prep: ChloraPrep and site prepped and draped Patient monitoring: heart rate, blood pressure and continuous pulse ox Approach: midline Location: L3-4 Needle Needle type: Pencan  Needle gauge: 24 G Needle length: 10 cm Assessment Sensory level: T6 Additional Notes Spinal kit date checked and verified. Pt tolerated procedure well, + CSF, - heme.      

## 2017-03-20 NOTE — Anesthesia Procedure Notes (Signed)
Anesthesia Regional Block: Adductor canal block   Pre-Anesthetic Checklist: ,, timeout performed, Correct Patient, Correct Site, Correct Laterality, Correct Procedure, Correct Position, site marked, Risks and benefits discussed,  Surgical consent,  Pre-op evaluation,  At surgeon's request and post-op pain management  Laterality: Left  Prep: chloraprep       Needles:  Injection technique: Single-shot  Needle Type: Echogenic Needle     Needle Length: 9cm      Additional Needles:   Procedures:,,,, ultrasound used (permanent image in chart),,,,  Narrative:  Start time: 03/20/2017 9:15 AM End time: 03/20/2017 9:24 AM Injection made incrementally with aspirations every 5 mL.  Performed by: Personally  Anesthesiologist: Myrtie Soman, MD  Additional Notes: Patient tolerated the procedure well without complications

## 2017-03-20 NOTE — Discharge Instructions (Signed)

## 2017-03-20 NOTE — Anesthesia Postprocedure Evaluation (Signed)
Anesthesia Post Note  Patient: MELONEE GERSTEL  Procedure(s) Performed: LEFT TOTAL KNEE ARTHROPLASTY (Left Knee)     Patient location during evaluation: PACU Anesthesia Type: Spinal Level of consciousness: oriented and awake and alert Pain management: pain level controlled Vital Signs Assessment: post-procedure vital signs reviewed and stable Respiratory status: spontaneous breathing, respiratory function stable and patient connected to nasal cannula oxygen Cardiovascular status: blood pressure returned to baseline and stable Postop Assessment: no headache, no backache and no apparent nausea or vomiting Anesthetic complications: no    Last Vitals:  Vitals:   03/20/17 1330 03/20/17 1340  BP: 129/69 129/69  Pulse: 70 70  Resp: 14 14  Temp: 36.8 C 36.8 C  SpO2: 98% 98%    Last Pain:  Vitals:   03/20/17 1340  TempSrc: Oral  PainSc: 0-No pain                 Sarea Fyfe S

## 2017-03-20 NOTE — Anesthesia Procedure Notes (Signed)
Anesthesia Procedure Image    

## 2017-03-20 NOTE — Op Note (Signed)
NAME:  Rebekah Wells RECORD NO.:  [id number]                             FACILITY:  [facility]      PHYSICIAN:  Pietro Cassis. Alvan Dame, M.D.  DATE OF BIRTH:  Jul 20, 1940      DATE OF PROCEDURE:  03/20/2017                                     OPERATIVE REPORT         PREOPERATIVE DIAGNOSIS:  Left knee osteoarthritis.      POSTOPERATIVE DIAGNOSIS:  Left knee osteoarthritis.      FINDINGS:  The patient was noted to have complete loss of cartilage and   bone-on-bone arthritis with associated osteophytes in the medial and patellofemoral compartments of   the knee with a significant synovitis and associated effusion.      PROCEDURE:  Left total knee replacement.      COMPONENTS USED:  DePuy Attune rotating platform posterior stabilized knee   system, a size 4 femur, 4 tibia, size 6 mm PS AOX insert, and 35 anatomic patellar   button.      SURGEON:  Pietro Cassis. Alvan Dame, M.D.      ASSISTANT:  Danae Orleans, PA-C.      ANESTHESIA:  Regional and Spinal.      SPECIMENS:  None.      COMPLICATION:  None.      DRAINS:  None.  EBL: <50cc      TOURNIQUET TIME:   Total Tourniquet Time Documented: Thigh (Left) - 26 minutes Total: Thigh (Left) - 26 minutes  .      The patient was stable to the recovery room.      INDICATION FOR PROCEDURE:  Rebekah Wells is a 77 y.o. female patient of   mine.  The patient had been seen, evaluated, and treated conservatively in the   office with medication, activity modification, and injections.  The patient had   radiographic changes of bone-on-bone arthritis with endplate sclerosis and osteophytes noted.      The patient failed conservative measures including medication, injections, and activity modification, and at this point was ready for more definitive measures.   Based on the radiographic changes and failed conservative measures, the patient   decided to proceed with total knee replacement.  Risks of infection,   DVT, component  failure, need for revision surgery, postop course, and   expectations were all   discussed and reviewed.  Consent was obtained for benefit of pain   relief.      PROCEDURE IN DETAIL:  The patient was brought to the operative theater.   Once adequate anesthesia, preoperative antibiotics, 2 gm of Ancef, 1 gm of Tranexamic Acid, and 10 mg of Decadron administered, the patient was positioned supine with the left thigh tourniquet placed.  The  left lower extremity was prepped and draped in sterile fashion.  A time-   out was performed identifying the patient, planned procedure, and   extremity.      The left lower extremity was placed in the [facility] leg holder.  The leg was   exsanguinated, tourniquet elevated to 250 mmHg.  A midline incision was  made followed by median parapatellar arthrotomy.  Following initial   exposure, attention was first directed to the patella.  Precut   measurement was noted to be 24 mm.  I resected down to 14 mm and used a   35 anatomic patellar button to restore patellar height as well as cover the cut   surface.      The lug holes were drilled and a metal shim was placed to protect the   patella from retractors and saw blades.      At this point, attention was now directed to the femur.  The femoral   canal was opened with a drill, irrigated to try to prevent fat emboli.  An   intramedullary rod was passed at 3 degrees valgus, 9 mm of bone was   resected off the distal femur.  Following this resection, the tibia was   subluxated anteriorly.  Using the extramedullary guide, 2 mm of bone was resected off   the proximal medial tibia.  We confirmed the gap would be   stable medially and laterally with the size 5 spacer block as well as confirmed   the cut was perpendicular in the coronal plane, checking with an alignment rod.      Once this was done, I sized the femur to be a size 4 in the anterior-   posterior dimension, chose a standard component based on medial  and   lateral dimension.  The size 4 rotation block was then pinned in   position anterior referenced using the C-clamp to set rotation.  The   anterior, posterior, and  chamfer cuts were made without difficulty nor   notching making certain that I was along the anterior cortex to help   with flexion gap stability.      The final box cut was made off the lateral aspect of distal femur.      At this point, the tibia was sized to be a size 4, the size 4 tray was   then pinned in position through the medial third of the tubercle,   drilled, and keel punched.  Trial reduction was now carried with a 4 femur,  4 tibia, a size 5 then 6 mm insert, and the 35 anatomic patella botton.  The knee was brought to   extension, full extension with good flexion stability with the patella   tracking through the trochlea without application of pressure.  Given   all these findings the femoral lug holes were drilled and then the trial components removed.  Final components were   opened and cement was mixed.  The knee was irrigated with normal saline   solution and pulse lavage.  The synovial lining was   then injected with 30 cc of 0.25% Marcaine with epinephrine and 1 cc of Toradol plus 30 cc of NS for a total of 61 cc.      The knee was irrigated.  Final implants were then cemented onto clean and   dried cut surfaces of bone with the knee brought to extension with a size 6 mm PS trial insert.      Once the cement had fully cured, the excess cement was removed   throughout the knee.  I confirmed I was satisfied with the range of   motion and stability, and the final size 6 mm PS AO X insert was chosen.  It was   placed into the knee.      The tourniquet had been let  down at 26 minutes.  No significant   hemostasis required The   extensor mechanism was then reapproximated using #1 Vicryl and #1 Stratafix sutures with the knee   in flexion.  The   remaining wound was closed with 2-0 Vicryl and running 4-0  Monocryl.   The knee was cleaned, dried, dressed sterilely using Dermabond and   Aquacel dressing.  The patient was then   brought to recovery room in stable condition, tolerating the procedure   well.   Please note that Physician Assistant, Danae Orleans, PA-C, was present for the entirety of the case, and was utilized for pre-operative positioning, peri-operative retractor management, general facilitation of the procedure.  He was also utilized for primary wound closure at the end of the case.              Pietro Cassis Alvan Dame, M.D.    03/20/2017 12:00 PM

## 2017-03-20 NOTE — Transfer of Care (Signed)
Immediate Anesthesia Transfer of Care Note  Patient: Rebekah Wells  Procedure(s) Performed: LEFT TOTAL KNEE ARTHROPLASTY (Left Knee)  Patient Location: PACU  Anesthesia Type:Spinal and MAC combined with regional for post-op pain  Level of Consciousness: awake, alert  and patient cooperative  Airway & Oxygen Therapy: Patient Spontanous Breathing and Patient connected to face mask oxygen  Post-op Assessment: Report given to RN and Post -op Vital signs reviewed and stable  Post vital signs: Reviewed and stable  Last Vitals:  Vitals:   03/20/17 0959 03/20/17 1004  BP: (!) 144/81 (!) 148/79  Pulse: 79 79  Resp: 18 18  Temp:    SpO2: 98% 98%    Last Pain:  Vitals:   03/20/17 0801  TempSrc:   PainSc: 2       Patients Stated Pain Goal: 4 (29/51/88 4166)  Complications: No apparent anesthesia complications

## 2017-03-20 NOTE — Progress Notes (Signed)
Assisted Dr. Rose with left, ultrasound guided, adductor canal block. Side rails up, monitors on throughout procedure. See vital signs in flow sheet. Tolerated Procedure well.  

## 2017-03-20 NOTE — Anesthesia Preprocedure Evaluation (Signed)
Anesthesia Evaluation  Patient identified by MRN, date of birth, ID band Patient awake    Reviewed: Allergy & Precautions, NPO status , Patient's Chart, lab work & pertinent test results  Airway Mallampati: II  TM Distance: >3 FB Neck ROM: Full    Dental no notable dental hx.    Pulmonary neg pulmonary ROS,    Pulmonary exam normal breath sounds clear to auscultation       Cardiovascular negative cardio ROS Normal cardiovascular exam Rhythm:Regular Rate:Normal     Neuro/Psych negative neurological ROS  negative psych ROS   GI/Hepatic Neg liver ROS, GERD  ,  Endo/Other  negative endocrine ROS  Renal/GU negative Renal ROS  negative genitourinary   Musculoskeletal negative musculoskeletal ROS (+)   Abdominal   Peds negative pediatric ROS (+)  Hematology negative hematology ROS (+)   Anesthesia Other Findings   Reproductive/Obstetrics negative OB ROS                             Anesthesia Physical Anesthesia Plan  ASA: II  Anesthesia Plan: Spinal   Post-op Pain Management:    Induction: Intravenous  PONV Risk Score and Plan:   Airway Management Planned: Simple Face Mask  Additional Equipment:   Intra-op Plan:   Post-operative Plan:   Informed Consent: I have reviewed the patients History and Physical, chart, labs and discussed the procedure including the risks, benefits and alternatives for the proposed anesthesia with the patient or authorized representative who has indicated his/her understanding and acceptance.   Dental advisory given  Plan Discussed with: CRNA and Surgeon  Anesthesia Plan Comments:         Anesthesia Quick Evaluation

## 2017-03-20 NOTE — Plan of Care (Signed)
Plan of care discussed.   

## 2017-03-21 ENCOUNTER — Encounter (HOSPITAL_COMMUNITY): Payer: Self-pay | Admitting: Orthopedic Surgery

## 2017-03-21 DIAGNOSIS — K219 Gastro-esophageal reflux disease without esophagitis: Secondary | ICD-10-CM | POA: Diagnosis present

## 2017-03-21 DIAGNOSIS — Z6825 Body mass index (BMI) 25.0-25.9, adult: Secondary | ICD-10-CM | POA: Diagnosis not present

## 2017-03-21 DIAGNOSIS — Z96651 Presence of right artificial knee joint: Secondary | ICD-10-CM | POA: Diagnosis present

## 2017-03-21 DIAGNOSIS — Z96698 Presence of other orthopedic joint implants: Secondary | ICD-10-CM | POA: Diagnosis present

## 2017-03-21 DIAGNOSIS — E785 Hyperlipidemia, unspecified: Secondary | ICD-10-CM | POA: Diagnosis present

## 2017-03-21 DIAGNOSIS — E663 Overweight: Secondary | ICD-10-CM | POA: Diagnosis present

## 2017-03-21 DIAGNOSIS — Z882 Allergy status to sulfonamides status: Secondary | ICD-10-CM | POA: Diagnosis not present

## 2017-03-21 DIAGNOSIS — J45909 Unspecified asthma, uncomplicated: Secondary | ICD-10-CM | POA: Diagnosis present

## 2017-03-21 DIAGNOSIS — Z7951 Long term (current) use of inhaled steroids: Secondary | ICD-10-CM | POA: Diagnosis not present

## 2017-03-21 DIAGNOSIS — M1712 Unilateral primary osteoarthritis, left knee: Secondary | ICD-10-CM | POA: Diagnosis present

## 2017-03-21 DIAGNOSIS — Z880 Allergy status to penicillin: Secondary | ICD-10-CM | POA: Diagnosis not present

## 2017-03-21 DIAGNOSIS — M25562 Pain in left knee: Secondary | ICD-10-CM | POA: Diagnosis present

## 2017-03-21 DIAGNOSIS — Z96652 Presence of left artificial knee joint: Secondary | ICD-10-CM

## 2017-03-21 LAB — BASIC METABOLIC PANEL
Anion gap: 8 (ref 5–15)
BUN: 12 mg/dL (ref 6–20)
CALCIUM: 8.5 mg/dL — AB (ref 8.9–10.3)
CO2: 22 mmol/L (ref 22–32)
CREATININE: 0.62 mg/dL (ref 0.44–1.00)
Chloride: 108 mmol/L (ref 101–111)
GFR calc Af Amer: >60 mL/min (ref >60)
GLUCOSE: 153 mg/dL — AB (ref 65–99)
Potassium: 4 mmol/L (ref 3.5–5.1)
Sodium: 138 mmol/L (ref 135–145)

## 2017-03-21 LAB — CBC
HEMATOCRIT: 32.6 % — AB (ref 36.0–46.0)
Hemoglobin: 10.3 g/dL — ABNORMAL LOW (ref 12.0–15.0)
MCH: 28.5 pg (ref 26.0–34.0)
MCHC: 31.6 g/dL (ref 30.0–36.0)
MCV: 90.1 fL (ref 78.0–100.0)
PLATELETS: 226 10*3/uL (ref 150–400)
RBC: 3.62 MIL/uL — ABNORMAL LOW (ref 3.87–5.11)
RDW: 12.2 % (ref 11.5–15.5)
WBC: 13.7 10*3/uL — AB (ref 4.0–10.5)

## 2017-03-21 NOTE — Progress Notes (Signed)
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 h/o GERD and asthma. Had to work a lot with OPPT for [place] .     PT Comments    POD # 1 pm session Assisted with amb a greater distance then returned to room to perfrom some TKR TE's.  Pt progressing well and plans to D/C to home tomorrow.   Follow Up Recommendations  Outpatient PT     Equipment Recommendations  None recommended by PT    Recommendations for Other Services       Precautions / Restrictions Precautions Precautions: Knee Precaution Comments: reviwed no pillow under knee Restrictions Weight Bearing Restrictions: No    Mobility  Bed Mobility               General bed mobility comments: OOB in recliner  Transfers Overall transfer level: Needs assistance Equipment used: Rolling walker (2 wheeled) Transfers: Sit to/from Stand Sit to Stand: Min guard;Supervision         General transfer comment: cues for placement of LLE during transfers and hand placement on RW for safety   Ambulation/Gait Ambulation/Gait assistance: Min assist;Min guard Ambulation Distance (Feet): 75 Feet Assistive device: Rolling walker (2 wheeled) Gait Pattern/deviations: Step-to pattern Gait velocity: decreased   General Gait Details: < 25% VC's on safety with turns and tolerated an increased distance   Stairs            Wheelchair Mobility    Modified Rankin (Stroke Patients Only)       Balance                                            Cognition Arousal/Alertness: Awake/alert Behavior During Therapy: WFL for tasks assessed/performed Overall Cognitive Status: Within Functional Limits for tasks assessed                                        Exercises   Total Knee Replacement TE's 10 reps B LE  ankle pumps 10 reps towel squeezes 10 reps knee presses 10 reps heel slides  10 reps SAQ's 10 reps SLR's 10 reps ABD Followed by ICE     General Comments        Pertinent Vitals/Pain Pain Assessment: 0-10 Pain Score: 5  Pain Location: knee Pain Descriptors / Indicators: Aching;Burning;Operative site guarding Pain Intervention(s): Monitored during session;Premedicated before session;Repositioned;Ice applied    Home Living                      Prior Function            PT Goals (current goals can now be found in the care plan section) Progress towards PT goals: Progressing toward goals    Frequency    7X/week      PT Plan Current plan remains appropriate    Co-evaluation              AM-PAC PT "6 Clicks" Daily Activity  Outcome Measure  Difficulty turning over in bed (including adjusting bedclothes, sheets and blankets)?: A Little Difficulty moving from lying on back to sitting on the side of the  bed? : A Little Difficulty sitting down on and standing up from a chair with arms (e.g., wheelchair, bedside commode, etc,.)?: A Little Help needed moving to and from a bed to chair (including a wheelchair)?: A Little Help needed walking in hospital room?: A Little Help needed climbing 3-5 steps with a railing? : A Little 6 Click Score: 18    End of Session Equipment Utilized During Treatment: Gait belt Activity Tolerance: Patient tolerated treatment well Patient left: in chair;with call bell/phone within reach;with chair alarm set Nurse Communication: Mobility status;Patient requests pain meds PT Visit Diagnosis: Other abnormalities of gait and mobility (R26.89)     Time: 7340-3709 PT Time Calculation (min) (ACUTE ONLY): 29 min  Charges:  $Gait Training: 8-22 mins $Therapeutic Exercise: 8-22 mins                    G Codes:       Rica Koyanagi  PTA [facility]  Acute  Rehab Pager      [phone]

## 2017-03-21 NOTE — Progress Notes (Addendum)
     Subjective: 1 Day Post-Op Procedure(s) (LRB): LEFT TOTAL KNEE ARTHROPLASTY (Left)   Patient reports pain as mild, pain controlled. No events throughout the night.  Discussed the use of a JAS splint for the right knee because of stiffness which will be used at home and ordered for the patient once she is discharged.  Plan for discharge tomorrow due to need for inpatient therapy to meet goal of being discharged home safely with family/caregiver.    Objective:   VITALS:   Vitals:   03/21/17 0150 03/21/17 0517  BP: (!) 99/57 102/63  Pulse: 75 86  Resp: 13 16  Temp: 98.2 F (36.8 C) 98.5 F (36.9 C)  SpO2: 99% 98%    Dorsiflexion/Plantar flexion intact Incision: dressing C/D/I No cellulitis present Compartment soft  LABS Recent Labs    03/21/17 0548  HGB 10.3*  HCT 32.6*  WBC 13.7*  PLT 226    Recent Labs    03/21/17 0548  NA 138  K 4.0  BUN 12  CREATININE 0.62  GLUCOSE 153*     Assessment/Plan: 1 Day Post-Op Procedure(s) (LRB): LEFT TOTAL KNEE ARTHROPLASTY (Left) Foley cath d/c'ed Advance diet Up with therapy D/C IV fluids Discharge home, eventually when ready  Overweight (BMI 25-29.9) Estimated body mass index is 26.04 kg/m as calculated from the following:   Height as of this encounter: 5\' 3"  (1.6 m).   Weight as of this encounter: 66.7 kg (147 lb). Patient also counseled that weight may inhibit the healing process Patient counseled that losing weight will help with future health issues      [place] Pugh. Dewell Monnier   PAC  03/21/2017, 8:38 AM

## 2017-03-21 NOTE — Progress Notes (Signed)
OT Cancellation Note  Patient Details Name: Rebekah Wells MRN: [id number] DOB: [birth year]   Cancelled Treatment:    Reason Eval/Treat Not Completed: OT screened, no needs identified, will sign off  Ridhima Golberg 03/21/2017, 9:04 AM  Lesle Chris, OTR/L [phone] 03/21/2017

## 2017-03-21 NOTE — Plan of Care (Signed)
Plan of care reviewed and discussed with patient.   

## 2017-03-21 NOTE — Progress Notes (Signed)
Physical Therapy Treatment Patient Details Name: Rebekah Wells MRN: [id number] DOB: [birth year] Today's Date: 03/21/2017    History of Present Illness pt admit for LTKA on 03/20/2017, had recent RTKA on 12/05/2016 . h/o GERD and asthma. Had to work a lot with OPPT for [place] .     PT Comments    POD # 1 am session Assisted to and from bathroom and a greater distance in hallway.    Follow Up Recommendations  Outpatient PT     Equipment Recommendations  None recommended by PT    Recommendations for Other Services       Precautions / Restrictions Precautions Precautions: Knee Precaution Comments: reviwed no pillow under knee Restrictions Weight Bearing Restrictions: No    Mobility  Bed Mobility               General bed mobility comments: OOB in recliner  Transfers Overall transfer level: Needs assistance Equipment used: Rolling walker (2 wheeled) Transfers: Sit to/from Stand Sit to Stand: Min guard;Supervision         General transfer comment: cues for placement of LLE during transfers and hand placement on RW for safety   Ambulation/Gait Ambulation/Gait assistance: Min assist;Min guard Ambulation Distance (Feet): 55 Feet Assistive device: Rolling walker (2 wheeled) Gait Pattern/deviations: Step-to pattern Gait velocity: decreased   General Gait Details: < 25% VC's on safety with turns and tolerated an increased distance   Stairs            Wheelchair Mobility    Modified Rankin (Stroke Patients Only)       Balance                                            Cognition Arousal/Alertness: Awake/alert Behavior During Therapy: WFL for tasks assessed/performed Overall Cognitive Status: Within Functional Limits for tasks assessed                                        Exercises      General Comments        Pertinent Vitals/Pain Pain Assessment: 0-10 Pain Score: 5  Pain Location: knee Pain  Descriptors / Indicators: Aching;Burning;Operative site guarding Pain Intervention(s): Monitored during session;Premedicated before session;Repositioned;Ice applied    Home Living                      Prior Function            PT Goals (current goals can now be found in the care plan section) Progress towards PT goals: Progressing toward goals    Frequency    7X/week      PT Plan Current plan remains appropriate    Co-evaluation              AM-PAC PT "6 Clicks" Daily Activity  Outcome Measure  Difficulty turning over in bed (including adjusting bedclothes, sheets and blankets)?: A Little Difficulty moving from lying on back to sitting on the side of the bed? : A Little Difficulty sitting down on and standing up from a chair with arms (e.g., wheelchair, bedside commode, etc,.)?: A Little Help needed moving to and from a bed to chair (including a wheelchair)?: A Little Help needed walking in hospital room?: A Little Help needed climbing 3-5  steps with a railing? : A Little 6 Click Score: 18    End of Session Equipment Utilized During Treatment: Gait belt Activity Tolerance: Patient tolerated treatment well Patient left: in chair;with call bell/phone within reach;with chair alarm set Nurse Communication: Mobility status;Patient requests pain meds PT Visit Diagnosis: Other abnormalities of gait and mobility (R26.89)     Time: 7096-2836 PT Time Calculation (min) (ACUTE ONLY): 15 min  Charges:  $Gait Training: 8-22 mins                    G Codes:       Rica Koyanagi  PTA [facility]  Acute  Rehab Pager      [phone]

## 2017-03-22 LAB — BASIC METABOLIC PANEL
Anion gap: 6 (ref 5–15)
BUN: 15 mg/dL (ref 6–20)
CHLORIDE: 107 mmol/L (ref 101–111)
CO2: 24 mmol/L (ref 22–32)
CREATININE: 0.58 mg/dL (ref 0.44–1.00)
Calcium: 8.3 mg/dL — ABNORMAL LOW (ref 8.9–10.3)
GFR calc Af Amer: >60 mL/min (ref >60)
GFR calc non Af Amer: >60 mL/min (ref >60)
GLUCOSE: 111 mg/dL — AB (ref 65–99)
POTASSIUM: 4 mmol/L (ref 3.5–5.1)
SODIUM: 137 mmol/L (ref 135–145)

## 2017-03-22 LAB — CBC
HCT: 30.9 % — ABNORMAL LOW (ref 36.0–46.0)
HEMOGLOBIN: 9.7 g/dL — AB (ref 12.0–15.0)
MCH: 28.4 pg (ref 26.0–34.0)
MCHC: 31.4 g/dL (ref 30.0–36.0)
MCV: 90.4 fL (ref 78.0–100.0)
Platelets: 191 10*3/uL (ref 150–400)
RBC: 3.42 MIL/uL — ABNORMAL LOW (ref 3.87–5.11)
RDW: 12.3 % (ref 11.5–15.5)
WBC: 14.2 10*3/uL — ABNORMAL HIGH (ref 4.0–10.5)

## 2017-03-22 MED ORDER — PANTOPRAZOLE SODIUM 40 MG PO TBEC: 40.0000 mg | DELAYED_RELEASE_TABLET | Freq: Every day | ORAL | Status: DC | PRN

## 2017-03-22 NOTE — Progress Notes (Signed)
Physical Therapy Treatment Patient Details Name: Rebekah Wells MRN: [id number] DOB: 04/04/1940 Today's Date: 03/22/2017    History of Present Illness pt admit for LTKA on 03/20/2017, had recent RTKA on 12/05/2016 . h/o GERD and asthma. Had to work a lot with OPPT for [place] .     PT Comments    POD # 2 am session Spouse present for "hands on' instruction.  This is pt's second TKR so very knowledgeable.  Practiced stairs. Pt ready for D/C to home   Follow Up Recommendations  Outpatient PT     Equipment Recommendations  None recommended by PT    Recommendations for Other Services       Precautions / Restrictions Precautions Precautions: Knee Precaution Comments: reviwed no pillow under knee Restrictions Weight Bearing Restrictions: No    Mobility  Bed Mobility               General bed mobility comments: OOB in recliner  Transfers Overall transfer level: Needs assistance Equipment used: Rolling walker (2 wheeled) Transfers: Sit to/from Stand Sit to Stand: Supervision         General transfer comment: cues for placement of LLE during transfers and hand placement on RW for safety   Ambulation/Gait Ambulation/Gait assistance: Supervision Ambulation Distance (Feet): 125 Feet Assistive device: Rolling walker (2 wheeled) Gait Pattern/deviations: Step-to pattern Gait velocity: decreased   General Gait Details: < 25% VC's on safety with turns and tolerated an increased distance   Stairs Stairs: Yes   Stair Management: Step to pattern;One rail Right;With cane Number of Stairs: 4 General stair comments: 25% VC's on proper tech and safety     spouse present for "hands on" instruction  Wheelchair Mobility    Modified Rankin (Stroke Patients Only)       Balance                                            Cognition Arousal/Alertness: Awake/alert Behavior During Therapy: WFL for tasks assessed/performed Overall Cognitive  Status: Within Functional Limits for tasks assessed                                        Exercises      General Comments        Pertinent Vitals/Pain Pain Assessment: Faces Pain Score: 7  Pain Location: knee Pain Descriptors / Indicators: Aching;Burning;Operative site guarding Pain Intervention(s): Premedicated before session;Monitored during session;Repositioned;Ice applied    Home Living                      Prior Function            PT Goals (current goals can now be found in the care plan section) Progress towards PT goals: Progressing toward goals    Frequency    7X/week      PT Plan Current plan remains appropriate    Co-evaluation              AM-PAC PT "6 Clicks" Daily Activity  Outcome Measure  Difficulty turning over in bed (including adjusting bedclothes, sheets and blankets)?: A Little Difficulty moving from lying on back to sitting on the side of the bed? : A Little Difficulty sitting down on and standing up from a chair with  arms (e.g., wheelchair, bedside commode, etc,.)?: A Little Help needed moving to and from a bed to chair (including a wheelchair)?: A Little Help needed walking in hospital room?: A Little Help needed climbing 3-5 steps with a railing? : A Little 6 Click Score: 18    End of Session Equipment Utilized During Treatment: Gait belt Activity Tolerance: Patient tolerated treatment well Patient left: in chair;with call bell/phone within reach;with chair alarm set Nurse Communication: (pt ready for D/C to home) PT Visit Diagnosis: Other abnormalities of gait and mobility (R26.89)     Time: 1103-1594 PT Time Calculation (min) (ACUTE ONLY): 27 min  Charges:  $Gait Training: 8-22 mins $Therapeutic Activity: 8-22 mins                    G Codes:       Rica Koyanagi  PTA [facility]  Acute  Rehab Pager      [phone]

## 2017-03-22 NOTE — Plan of Care (Signed)
Plan of care reviewed. 

## 2017-03-22 NOTE — Discharge Summary (Signed)
Physician Discharge Summary  Patient ID: Rebekah Wells MRN: [id number] DOB/AGE: 10/25/1940 77 y.o.  Admit date: 03/20/2017 Discharge date:  03/22/2017  Procedures:  Procedure(s) (LRB): LEFT TOTAL KNEE ARTHROPLASTY (Left)  Attending Physician:  Dr. Paralee Cancel   Admission Diagnoses:   Left knee primary OA / pain  Discharge Diagnoses:  Principal Problem:   S/P left TKA Active Problems:   Overweight (BMI 25.0-29.9)   S/P total knee replacement, left  Past Medical History:  Diagnosis Date  . Arthritis    OA  . Asthma   . Dysrhythmia    tachycardia  . Gastritis   . GERD (gastroesophageal reflux disease)   . H/O seasonal allergies   . Hyperlipemia     HPI:    Rebekah Wells, 77 y.o. female, has a history of pain and functional disability in the left knee due to arthritis and has failed non-surgical conservative treatments for greater than 12 weeks to include NSAID's and/or analgesics, corticosteriod injections and activity modification.  Onset of symptoms was gradual, starting  years ago with gradually worsening course since that time. The patient noted prior procedures on the knee to include  arthroscopy on the left knee.  Patient currently rates pain in the left knee(s) at 4 out of 10 with activity. Patient has worsening of pain with activity and weight bearing, pain that interferes with activities of daily living, pain with passive range of motion, crepitus and joint swelling.  Patient has evidence of periarticular osteophytes and joint space narrowing by imaging studies.  There is no active infection.  Risks, benefits and expectations were discussed with the patient.  Risks including but not limited to the risk of anesthesia, blood clots, nerve damage, blood vessel damage, failure of the prosthesis, infection and up to and including death.  Patient understand the risks, benefits and expectations and wishes to proceed with surgery.   PCP: Rebekah Grumbles, MD   Discharged  Condition: good  Hospital Course:  Patient underwent the above stated procedure on 03/20/2017. Patient tolerated the procedure well and brought to the recovery room in good condition and subsequently to the floor.  POD #1 BP: 102/63 ; Pulse: 86 ; Temp: 98.5 F (36.9 C) ; Resp: 16 Patient reports pain as mild, pain controlled. No events throughout the night.  Discussed the use of a JAS splint for the right knee because of stiffness which will be used at home and ordered for the patient once she is discharged.  Plan for discharge tomorrowdue to need for inpatient therapy to meet goal of being discharged home safely with family/caregiver.  Dorsiflexion/plantar flexion intact, incision: dressing C/D/I, no cellulitis present and compartment soft.   LABS  Basename    HGB     10.3  HCT     32.6   POD #2  BP: 115/68 ; Pulse: 80 ; Temp: 98.8 F (37.1 C) ; Resp: 15 Patient reports pain as mild, pain controlled. No events throughout the night. Feels that she had a better night last night and able to sleep more.  Ready to be discharged home.  Dorsiflexion/plantar flexion intact, incision: dressing C/D/I, no cellulitis present and compartment soft.   LABS  Basename    HGB     9.7  HCT     30.9    Discharge Exam: General appearance: alert, cooperative and no distress Extremities: Homans sign is negative, no sign of DVT, no edema, redness or tenderness in the calves or thighs and no ulcers, gangrene  or trophic changes  Disposition: Home with follow up in 2 weeks   Follow-up Information    Paralee Cancel, MD. Schedule an appointment as soon as possible for a visit in 2 week(s).   Specialty:  Orthopedic Surgery Contact information: [address] [place] [address] [phone]           Discharge Instructions    Call MD / Call 911   Complete by:  As directed    If you experience chest pain or shortness of breath, CALL 911 and be transported to the hospital emergency  room.  If you develope a fever above 101 F, pus (white drainage) or increased drainage or redness at the wound, or calf pain, call your surgeon's office.   Change dressing   Complete by:  As directed    Maintain surgical dressing until follow up in the clinic. If the edges start to pull up, may reinforce with tape. If the dressing is no longer working, may remove and cover with gauze and tape, but must keep the area dry and clean.  Call with any questions or concerns.   Constipation Prevention   Complete by:  As directed    Drink plenty of fluids.  Prune juice may be helpful.  You may use a stool softener, such as Colace (over the counter) 100 mg twice a day.  Use MiraLax (over the counter) for constipation as needed.   Diet - low sodium heart healthy   Complete by:  As directed    Discharge instructions   Complete by:  As directed    Maintain surgical dressing until follow up in the clinic. If the edges start to pull up, may reinforce with tape. If the dressing is no longer working, may remove and cover with gauze and tape, but must keep the area dry and clean.  Follow up in 2 weeks at [facility]. Call with any questions or concerns.   Increase activity slowly as tolerated   Complete by:  As directed    Weight bearing as tolerated with assist device (walker, cane, etc) as directed, use it as long as suggested by your surgeon or therapist, typically at least 4-6 weeks.   TED hose   Complete by:  As directed    Use stockings (TED hose) for 2 weeks on both leg(s).  You may remove them at night for sleeping.      Allergies as of 03/22/2017      Reactions   Penicillins Rash, Other (See Comments)   Has patient had a PCN reaction causing immediate rash, facial/tongue/throat swelling, SOB or lightheadedness with hypotension: Yes Has patient had a PCN reaction causing severe rash involving mucus membranes or skin necrosis: Yes Has patient had a PCN reaction that required  hospitalization: No - already in hospital  Has patient had a PCN reaction occurring within the last 10 years: No If all of the above answers are "NO", then may proceed with Cephalosporin use.   Sulfa Antibiotics Rash      Medication List    STOP taking these medications   meloxicam 15 MG tablet Commonly known as:  MOBIC     TAKE these medications   albuterol 108 (90 Base) MCG/ACT inhaler Commonly known as:  PROVENTIL HFA;VENTOLIN HFA Inhale 1-2 puffs into the lungs every 6 (six) hours as needed for wheezing or shortness of breath.   aspirin 81 MG chewable tablet Commonly known as:  ASPIRIN CHILDRENS Chew 1 tablet (81 mg total)  by mouth 2 (two) times daily. Take for 4 weeks, then resume regular dose.   beta carotene w/minerals tablet Take 1 tablet by mouth daily.   MULTIVITAMIN PO Take 1 tablet by mouth daily.   calcium carbonate 600 MG Tabs tablet Commonly known as:  OS-CAL Take 600 mg by mouth daily.   cetirizine 10 MG tablet Commonly known as:  ZYRTEC Take 10 mg by mouth daily as needed for allergies.   docusate sodium 100 MG capsule Commonly known as:  COLACE Take 1 capsule (100 mg total) by mouth 2 (two) times daily.   ferrous sulfate 325 (65 FE) MG tablet Commonly known as:  FERROUSUL Take 1 tablet (325 mg total) by mouth 3 (three) times daily with meals.   Fluticasone-Salmeterol 100-50 MCG/DOSE Aepb Commonly known as:  ADVAIR Inhale 1 puff into the lungs daily.   HYDROcodone-acetaminophen 7.5-325 MG tablet Commonly known as:  NORCO Take 1-2 tablets by mouth every 4 (four) hours as needed for moderate pain.   methocarbamol 500 MG tablet Commonly known as:  ROBAXIN Take 1 tablet (500 mg total) by mouth every 6 (six) hours as needed for muscle spasms.   metoprolol succinate 25 MG 24 hr tablet Commonly known as:  TOPROL-XL Take 25 mg by mouth daily.   mometasone-formoterol 100-5 MCG/ACT Aero Commonly known as:  DULERA Inhale 2 puffs into the lungs 2  (two) times daily.   omeprazole 20 MG capsule Commonly known as:  PRILOSEC Take 20 mg by mouth daily as needed (for acid reflex).   polyethylene glycol packet Commonly known as:  MIRALAX / GLYCOLAX Take 17 g by mouth 2 (two) times daily.   simvastatin 40 MG tablet Commonly known as:  ZOCOR Take 40 mg by mouth every evening.   traZODone 50 MG tablet Commonly known as:  DESYREL Take 25 mg by mouth at bedtime as needed for sleep.            Discharge Care Instructions  (From admission, onward)        Start     Ordered   03/21/17 0000  Change dressing    Comments:  Maintain surgical dressing until follow up in the clinic. If the edges start to pull up, may reinforce with tape. If the dressing is no longer working, may remove and cover with gauze and tape, but must keep the area dry and clean.  Call with any questions or concerns.   03/21/17 0859       Signed: [place] Pugh. Mischelle Reeg   PA-C  03/22/2017, 8:29 AM

## 2017-03-22 NOTE — Progress Notes (Signed)
     Subjective: 2 Days Post-Op Procedure(s) (LRB): LEFT TOTAL KNEE ARTHROPLASTY (Left)   Patient reports pain as mild, pain controlled. No events throughout the night. Feels that she had a better night last night and able to sleep more.  Ready to be discharged home.   Objective:   VITALS:   Vitals:   03/21/17 2050 03/22/17 0527  BP: 132/66 115/68  Pulse: 95 80  Resp: 16 15  Temp: 99.2 F (37.3 C) 98.8 F (37.1 C)  SpO2: 95% 96%    Dorsiflexion/Plantar flexion intact Incision: dressing C/D/I No cellulitis present Compartment soft  LABS Recent Labs    03/21/17 0548 03/22/17 0604  HGB 10.3* 9.7*  HCT 32.6* 30.9*  WBC 13.7* 14.2*  PLT 226 191    Recent Labs    03/21/17 0548 03/22/17 0604  NA 138 137  K 4.0 4.0  BUN 12 15  CREATININE 0.62 0.58  GLUCOSE 153* 111*     Assessment/Plan: 2 Days Post-Op Procedure(s) (LRB): LEFT TOTAL KNEE ARTHROPLASTY (Left) Up with therapy Discharge home Follow up in 2 weeks at [facility]. Follow up with OLIN,Zelena Bushong D in 2 weeks.  Contact information:  [facility] [address], [address] [place] [place]   PAC  03/22/2017, 7:40 AM

## 2017-11-14 ENCOUNTER — Telehealth: Payer: Self-pay | Admitting: Oncology

## 2017-11-14 ENCOUNTER — Encounter: Payer: Self-pay | Admitting: *Deleted

## 2017-11-14 DIAGNOSIS — C50411 Malignant neoplasm of upper-outer quadrant of right female breast: Secondary | ICD-10-CM

## 2017-11-14 DIAGNOSIS — C50211 Malignant neoplasm of upper-inner quadrant of right female breast: Secondary | ICD-10-CM

## 2017-11-14 DIAGNOSIS — Z170 Estrogen receptor positive status [ER+]: Secondary | ICD-10-CM

## 2017-11-14 NOTE — Telephone Encounter (Signed)
Spoke with patients daughter Dr. Janace Hoard to confirm afternoon [facility] appointment on 10/2, packet emailed to daughter and she will get it to patient

## 2017-11-15 ENCOUNTER — Telehealth: Payer: Self-pay | Admitting: Oncology

## 2017-11-15 NOTE — Telephone Encounter (Signed)
Spoke to patients husband to confirm that pathology slides were ordered, fax sent to [facility] at [phone], Heather's call back is [phone]. Confirmation received

## 2017-11-16 ENCOUNTER — Telehealth: Payer: Self-pay | Admitting: *Deleted

## 2017-11-16 DIAGNOSIS — C50311 Malignant neoplasm of lower-inner quadrant of right female breast: Secondary | ICD-10-CM | POA: Insufficient documentation

## 2017-11-16 DIAGNOSIS — Z170 Estrogen receptor positive status [ER+]: Principal | ICD-10-CM

## 2017-11-16 NOTE — Telephone Encounter (Signed)
[employer] [facility] for breast imaging reports and images. Informed that images and reports were fed ex on 11/15/17.

## 2017-11-20 ENCOUNTER — Other Ambulatory Visit: Payer: Self-pay | Admitting: Oncology

## 2017-11-20 NOTE — Progress Notes (Signed)
[facility]  [phone] [phone]     ID: Rebekah Wells DOB: Dec 03, 1940  MR#: [id number]  CSN#:[id number]  Patient Care Team: Lennox Grumbles, MD as PCP - General (Family Medicine) Stark Klein, MD as Consulting Physician (General Surgery) Arminda Foglio, Virgie Dad, MD as Consulting Physician (Oncology) Kyung Rudd, MD as Consulting Physician (Radiation Oncology) Paralee Cancel, MD as Consulting Physician (Orthopedic Surgery) OTHER MD:   CHIEF COMPLAINT: Estrogen receptor positive breast cancer  CURRENT TREATMENT: Definitive surgery pending   HISTORY OF CURRENT ILLNESS: Rebekah Wells had routine screening mammography on 10/03/2017 showing a possible abnormality in the right breast. She underwent unilateral right diagnostic mammography with tomography and right breast ultrasonography at the [facility] in [place], [place] on 10/17/2017 showing: breast density category C. In the right breast lower inner quadrant at 5:30 o' clock and 2 cm from the nipple, there is a hypoechoic mass that measures 0.8 x 0.9 x 0.9 cm. Additionally, at the upper outer quadrant at 10 o'clock and 7 cm from the nipple there is an ill defined area with focal mixed echogenicity. Sonographically, there was one lymph node in the right axilla with a slightly thickened cortex.   Accordingly on 11/20/2017 she proceeded to biopsy of the right breast area in question. The pathology from this procedure showed ([id number]): At 10 o' clock, benign breast tissue with no malignancy identified.  Because there was significant fibrosis in the area, this may be discordant.  At the 5:30 lower inner quadrant, invasive ductal carcinoma grade II, with calcifications. Prognostic indicators significant for: estrogen receptor, 100% positive with strong staining intensity and progesterone receptor, 40% positive with moderate staining intensity. HER 2 is negative by FISH, with a signals ratio of 1.29, and the  average number per cell 1.9.  The patient's subsequent history is as detailed below.  INTERVAL HISTORY: Rebekah Wells was evaluated in the multidisciplinary breast cancer clinic on 11/21/2017 accompanied by her husband and daughter, Dr. Gavin Pound. The patient's case was also presented at the multidisciplinary breast cancer conference on the same day. At that time a preliminary plan was proposed: MRI, with additional biopsies as needed; consider adjuvant radiation; no chemotherapy anticipated, but antiestrogens to follow   REVIEW OF SYSTEMS: There were no specific symptoms leading to the original mammogram, which was routinely scheduled. The patient denies unusual headaches, visual changes, nausea, vomiting, stiff neck, dizziness, or gait imbalance. There has been no cough, phlegm production, or pleurisy, no chest pain or pressure, and no change in bowel or bladder habits. The patient denies fever, rash, bleeding, unexplained fatigue or unexplained weight loss. A detailed review of systems was otherwise entirely negative.   PAST MEDICAL HISTORY: Past Medical History:  Diagnosis Date  . Arthritis    OA  . Asthma   . Dysrhythmia    tachycardia  . Gastritis   . GERD (gastroesophageal reflux disease)   . H/O seasonal allergies   . Hyperlipemia     PAST SURGICAL HISTORY: Past Surgical History:  Procedure Laterality Date  . ABDOMINAL HYSTERECTOMY    . CARPAL TUNNEL RELEASE Left 07/09/2014   Procedure: LEFT CARPAL TUNNEL RELEASE;  Surgeon: Daryll Brod, MD;  Location: [facility];  Service: Orthopedics;  Laterality: Left;  . CESAREAN SECTION    . CHOLECYSTECTOMY    . DG TOES RT FOOT MULTIPLE SPECIF (Bennett HX) Right   . DIAGNOSTIC LAPAROSCOPY    . DIGIT NAIL REMOVAL Right 12/05/2016   Procedure: REMOVAL OF SCREW RIGHT  GREAT TOE;  Surgeon: Paralee Cancel, MD;  Location: [facility];  Service: Orthopedics;  Laterality: Right;  . EYE SURGERY     bilateral cataract with lens implants  .  HAND ARTHROPLASTY Right    for OA  . KNEE ARTHROSCOPY     x3  . NISSEN FUNDOPLICATION    . TONSILLECTOMY    . TOTAL KNEE ARTHROPLASTY Right 12/05/2016   Procedure: RIGHT TOTAL KNEE ARTHROPLASTY;  Surgeon: Paralee Cancel, MD;  Location: [facility];  Service: Orthopedics;  Laterality: Right;  90 mins  . TOTAL KNEE ARTHROPLASTY Left 03/20/2017   Procedure: LEFT TOTAL KNEE ARTHROPLASTY;  Surgeon: Paralee Cancel, MD;  Location: [facility];  Service: Orthopedics;  Laterality: Left;  70 mins  Appendectomy   FAMILY HISTORY Family History  Problem Relation Age of Onset  . Colon cancer Mother   . CAD Father 26  . Lung cancer Sister   The patient's father died at age 21 of CAD. The patient's mother died at age 36 due to colon cancer. The patient had 2 brothers and 2 sisters.  1 of the patient's sisters died of lung cancer ( heavy smoker). The patient's other sister is alive with CLL. There was a maternal grandfather with colon cancer. There was a maternal grandmother with sarcoma of the leg. The patient denies a family history of breast or ovarian cancer.   GYNECOLOGIC HISTORY:  No LMP recorded. Patient has had a hysterectomy. Menarche: 77 years old Age at first live birth: 77 years old She is GXP4.  She is status post patial hysterectomy without BSO. Her LMP was around age 5. She used HRT for 10 years. She used oral contraception for 1 year in 1969.    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 Her husband, [name] was a Personal assistant. The patient's daughter [name] is a respiratory therapist in [place], [place]. The patient's son, Virgel Bouquet lives in [place], [place] and works in Risk analyst and event planning. The patient's daughter, Junita Push lives in [place], [place] and is a Theatre manager. The patient's daughter Dr. Sheryn Bison is a rheumatologist in [place].      ADVANCED DIRECTIVES:    HEALTH MAINTENANCE: Social History   Tobacco Use  . Smoking status: Never Smoker  . Smokeless tobacco:  Never Used  Substance Use Topics  . Alcohol use: No  . Drug use: No     Colonoscopy: in [place] over 7 years ago  PAP: remote  Bone density: 10 years ago   Allergies  Allergen Reactions  . Other     Steri Strips/Surgi glue (Dermatitis)  . Penicillins Rash and Other (See Comments)    Has patient had a PCN reaction causing immediate rash, facial/tongue/throat swelling, SOB or lightheadedness with hypotension: Yes Has patient had a PCN reaction causing severe rash involving mucus membranes or skin necrosis: Yes Has patient had a PCN reaction that required hospitalization: No - already in hospital  Has patient had a PCN reaction occurring within the last 10 years: No If all of the above answers are "NO", then may proceed with Cephalosporin use.   . Sulfa Antibiotics Rash    Current Outpatient Medications  Medication Sig Dispense Refill  . albuterol (PROVENTIL HFA;VENTOLIN HFA) 108 (90 BASE) MCG/ACT inhaler Inhale 1-2 puffs into the lungs every 6 (six) hours as needed for wheezing or shortness of breath.     . beta carotene w/minerals (OCUVITE) tablet Take 1 tablet by mouth daily.    . calcium carbonate (OS-CAL)  600 MG TABS tablet Take 600 mg by mouth daily.     . cetirizine (ZYRTEC) 10 MG tablet Take 10 mg by mouth daily as needed for allergies.     Marland Kitchen docusate sodium (COLACE) 100 MG capsule Take 1 capsule (100 mg total) by mouth 2 (two) times daily. 10 capsule 0  . Fluticasone-Salmeterol (ADVAIR) 100-50 MCG/DOSE AEPB Inhale 1 puff into the lungs daily.    . metoprolol succinate (TOPROL-XL) 25 MG 24 hr tablet Take 25 mg by mouth daily.    . Multiple Vitamins-Minerals (MULTIVITAMIN PO) Take 1 tablet by mouth daily.    Marland Kitchen omeprazole (PRILOSEC) 20 MG capsule Take 20 mg by mouth daily as needed (for acid reflex).     . simvastatin (ZOCOR) 40 MG tablet Take 40 mg by mouth every evening.     . traZODone (DESYREL) 50 MG tablet Take 25 mg by mouth at bedtime as needed for sleep.     No current  facility-administered medications for this visit.     OBJECTIVE: Older white woman in no acute distress  Vitals:   11/21/17 1238  BP: 123/73  Pulse: (!) 104  Resp: 18  Temp: 98 F (36.7 C)  SpO2: 98%     Body mass index is 26.57 kg/m.   Wt Readings from Last 3 Encounters:  11/21/17 150 lb (68 kg)  03/20/17 147 lb (66.7 kg)  03/02/17 147 lb 6 oz (66.8 kg)      ECOG FS:1 - Symptomatic but completely ambulatory  Ocular: Sclerae unicteric, pupils round and equal Lymphatic: No cervical or supraclavicular adenopathy Lungs no rales or rhonchi Heart regular rate and rhythm Abd soft, nontender, positive bowel sounds MSK no focal spinal tenderness, no joint edema Neuro: non-focal, well-oriented, appropriate affect Breasts: The right breast is status post recent biopsy.  I do not palpate a mass.  There are no skin or nipple changes of concern.  The left breast is benign.  Both axillae are benign.   LAB RESULTS:  CMP     Component Value Date/Time   NA 140 11/21/2017 1221   K 4.0 11/21/2017 1221   CL 105 11/21/2017 1221   CO2 25 11/21/2017 1221   GLUCOSE 177 (H) 11/21/2017 1221   BUN 13 11/21/2017 1221   CREATININE 0.75 11/21/2017 1221   CALCIUM 9.4 11/21/2017 1221   PROT 6.9 11/21/2017 1221   ALBUMIN 4.0 11/21/2017 1221   AST 39 11/21/2017 1221   ALT 44 11/21/2017 1221   ALKPHOS 78 11/21/2017 1221   BILITOT 0.4 11/21/2017 1221   GFRNONAA >60 11/21/2017 1221   GFRAA >60 11/21/2017 1221    No results found for: TOTALPROTELP, ALBUMINELP, A1GS, A2GS, BETS, BETA2SER, GAMS, MSPIKE, SPEI  No results found for: KPAFRELGTCHN, LAMBDASER, KAPLAMBRATIO  Lab Results  Component Value Date   WBC 7.7 11/21/2017   NEUTROABS 3.7 11/21/2017   HGB 12.7 11/21/2017   HCT 40.5 11/21/2017   MCV 89.6 11/21/2017   PLT 229 11/21/2017    '@LASTCHEMISTRY'$ @  No results found for: LABCA2  No components found for: JOINOM767  No results for input(s): INR in the last 168 hours.  No  results found for: LABCA2  No results found for: MCN470  No results found for: JGG836  No results found for: OQH476  No results found for: CA2729  No components found for: HGQUANT  No results found for: CEA1 / No results found for: CEA1   No results found for: AFPTUMOR  No results found for: [facility]  No results found for: PSA1  Appointment on 11/21/2017  Component Date Value Ref Range Status  . Sodium 11/21/2017 140  135 - 145 mmol/L Final  . Potassium 11/21/2017 4.0  3.5 - 5.1 mmol/L Final  . Chloride 11/21/2017 105  98 - 111 mmol/L Final  . CO2 11/21/2017 25  22 - 32 mmol/L Final  . Glucose, Bld 11/21/2017 177* 70 - 99 mg/dL Final  . BUN 11/21/2017 13  8 - 23 mg/dL Final  . Creatinine 11/21/2017 0.75  0.44 - 1.00 mg/dL Final  . Calcium 11/21/2017 9.4  8.9 - 10.3 mg/dL Final  . Total Protein 11/21/2017 6.9  6.5 - 8.1 g/dL Final  . Albumin 11/21/2017 4.0  3.5 - 5.0 g/dL Final  . AST 11/21/2017 39  15 - 41 U/L Final  . ALT 11/21/2017 44  0 - 44 U/L Final  . Alkaline Phosphatase 11/21/2017 78  38 - 126 U/L Final  . Total Bilirubin 11/21/2017 0.4  0.3 - 1.2 mg/dL Final  . GFR, Est Non Af Am 11/21/2017 >60  >60 mL/min Final  . GFR, Est AFR Am 11/21/2017 >60  >60 mL/min Final   Comment: (NOTE) The eGFR has been calculated using the CKD EPI equation. This calculation has not been validated in all clinical situations. eGFR's persistently <60 mL/min signify possible Chronic Kidney Disease.   Georgiann Hahn gap 11/21/2017 10  5 - 15 Final   Performed at [facility], [place] [address], [place], [place] [address]  . WBC Count 11/21/2017 7.7  3.9 - 10.3 K/uL Final  . RBC 11/21/2017 4.52  3.70 - 5.45 MIL/uL Final  . Hemoglobin 11/21/2017 12.7  11.6 - 15.9 g/dL Final  . HCT 11/21/2017 40.5  34.8 - 46.6 % Final  . MCV 11/21/2017 89.6  79.5 - 101.0 fL Final  . MCH 11/21/2017 28.1  25.1 - 34.0 pg Final  . MCHC 11/21/2017 31.4* 31.5 - 36.0 g/dL Final  . RDW  11/21/2017 13.1  11.2 - 14.5 % Final  . Platelet Count 11/21/2017 229  145 - 400 K/uL Final  . Neutrophils Relative % 11/21/2017 48  % Final  . Neutro Abs 11/21/2017 3.7  1.5 - 6.5 K/uL Final  . Lymphocytes Relative 11/21/2017 43  % Final  . Lymphs Abs 11/21/2017 3.3  0.9 - 3.3 K/uL Final  . Monocytes Relative 11/21/2017 7  % Final  . Monocytes Absolute 11/21/2017 0.5  0.1 - 0.9 K/uL Final  . Eosinophils Relative 11/21/2017 1  % Final  . Eosinophils Absolute 11/21/2017 0.1  0.0 - 0.5 K/uL Final  . Basophils Relative 11/21/2017 1  % Final  . Basophils Absolute 11/21/2017 0.0  0.0 - 0.1 K/uL Final   Performed at [facility], [place] [address], [place], [place] [address]    (this displays the last labs from the last 3 days)  No results found for: TOTALPROTELP, ALBUMINELP, A1GS, A2GS, BETS, BETA2SER, GAMS, MSPIKE, SPEI (this displays SPEP labs)  No results found for: KPAFRELGTCHN, LAMBDASER, KAPLAMBRATIO (kappa/lambda light chains)  No results found for: HGBA, HGBA2QUANT, HGBFQUANT, HGBSQUAN (Hemoglobinopathy evaluation)   No results found for: LDH  No results found for: IRON, TIBC, IRONPCTSAT (Iron and TIBC)  No results found for: FERRITIN  Urinalysis No results found for: COLORURINE, APPEARANCEUR, LABSPEC, PHURINE, GLUCOSEU, HGBUR, BILIRUBINUR, KETONESUR, PROTEINUR, UROBILINOGEN, NITRITE, LEUKOCYTESUR   STUDIES: No results found.  ELIGIBLE FOR AVAILABLE RESEARCH PROTOCOL: no  ASSESSMENT: 77 y.o. Rebekah Wells, [place] woman status post right  breast biopsy x2 on 10/24/2017 for a clinical T1b NX, stage IA invasive ductal carcinoma, grade 2, estrogen and progesterone receptor positive, HER-2 not amplified  (a) biopsy of a second site in the same breast showed fibrosis, but is discordant  (1) definitive surgery pending  (2) adjuvant radiation as appropriate   (3) antiestrogens to follow  PLAN: I spent approximately 60 minutes face to face with Rebekah Wells with more  than 50% of that time spent in counseling and coordination of care. Specifically we reviewed the biology of the patient's diagnosis and the specifics of her situation. We first reviewed the fact that cancer is not one disease but more than 100 different diseases and that it is important to keep them separate-- otherwise when friends and relatives discuss their own cancer experiences with Rebekah Wells confusion can result. Similarly we explained that if breast cancer spreads to the bone or liver, the patient would not have bone cancer or liver cancer, but breast cancer in the bone and breast cancer in the liver: one cancer in three places-- not 3 different cancers which otherwise would have to be treated in 3 different ways.  We discussed the difference between local and systemic therapy. In terms of loco-regional treatment, lumpectomy plus radiation is equivalent to mastectomy as far as survival is concerned. For this reason, and because the cosmetic results are generally superior, we recommend breast conserving surgery.   We then discussed the rationale for systemic therapy. There is some risk that this cancer may have already spread to other parts of her body. Patients frequently ask at this point about bone scans, CAT scans and PET scans to find out if they have occult breast cancer somewhere else. The problem is that in early stage disease we are much more likely to find false positives then true cancers and this would expose the patient to unnecessary procedures as well as unnecessary radiation. Scans cannot answer the question the patient really would like to know, which is whether she has microscopic disease elsewhere in her body. For those reasons we do not recommend them.  Of course we would proceed to aggressive evaluation of any symptoms that might suggest metastatic disease, but that is not the case here.  Next we went over the options for systemic therapy which are anti-estrogens, anti-HER-2  immunotherapy, and chemotherapy. Rebekah Wells does not meet criteria for anti-HER-2 immunotherapy. She is a good candidate for anti-estrogens.  The question of chemotherapy is more complicated. Chemotherapy is most effective in rapidly growing, aggressive tumors. It is much less effective in lower-grade, slow growing cancers. For that reason and also taking the patient's age and consideration (data for patients above 70 is more sparse, making standard recommendations less certain) we do not anticipate her needing chemotherapy.  We can reassess this depending on final surgical results  Accordingly the plan will be to obtain an MRI, biopsy any other suspicious areas, and then proceed to definitive surgery.  Decision regarding adjuvant radiation can be made at that time.  She will then start antiestrogens.  Rebekah Wells has a good understanding of the overall plan. She agrees with it. She knows the goal of treatment in her case is cure. She will call with any problems that may develop before her next visit here.   Jhamari Markowicz, Virgie Dad, MD  11/21/17 3:57 PM Medical Oncology and Hematology [facility] [address] [place], [place] [address] Tel. [phone]    Fax. [phone]  Rebekah Wells, am acting as scribe for  Chauncey Cruel MD.  I, Lurline Del MD, have reviewed the above documentation for accuracy and completeness, and I agree with the above.

## 2017-11-21 ENCOUNTER — Other Ambulatory Visit: Payer: Self-pay | Admitting: *Deleted

## 2017-11-21 ENCOUNTER — Ambulatory Visit: Payer: Medicare Other | Attending: General Surgery | Admitting: Physical Therapy

## 2017-11-21 ENCOUNTER — Ambulatory Visit
Admission: RE | Admit: 2017-11-21 | Discharge: 2017-11-21 | Disposition: A | Discharge status: Home / Self Care | Payer: Medicare Other | Source: Ambulatory Visit | Attending: Radiation Oncology | Admitting: Radiation Oncology

## 2017-11-21 ENCOUNTER — Inpatient Hospital Stay: Payer: Medicare Other | Attending: Oncology | Admitting: Oncology

## 2017-11-21 ENCOUNTER — Encounter: Payer: Self-pay | Admitting: Oncology

## 2017-11-21 ENCOUNTER — Inpatient Hospital Stay: Payer: Medicare Other

## 2017-11-21 ENCOUNTER — Encounter: Payer: Self-pay | Admitting: Physical Therapy

## 2017-11-21 ENCOUNTER — Other Ambulatory Visit: Payer: Self-pay

## 2017-11-21 VITALS — BP 123/73 | HR 104 | Temp 98.0°F | Resp 18 | Ht 63.0 in | Wt 150.0 lb

## 2017-11-21 DIAGNOSIS — Z170 Estrogen receptor positive status [ER+]: Secondary | ICD-10-CM | POA: Insufficient documentation

## 2017-11-21 DIAGNOSIS — R293 Abnormal posture: Secondary | ICD-10-CM | POA: Diagnosis present

## 2017-11-21 DIAGNOSIS — C50311 Malignant neoplasm of lower-inner quadrant of right female breast: Secondary | ICD-10-CM | POA: Diagnosis present

## 2017-11-21 DIAGNOSIS — C50411 Malignant neoplasm of upper-outer quadrant of right female breast: Secondary | ICD-10-CM

## 2017-11-21 LAB — CBC WITH DIFFERENTIAL (CANCER CENTER ONLY)
BASOS ABS: 0 10*3/uL (ref 0.0–0.1)
Basophils Relative: 1 %
EOS PCT: 1 %
Eosinophils Absolute: 0.1 10*3/uL (ref 0.0–0.5)
HCT: 40.5 % (ref 34.8–46.6)
HEMOGLOBIN: 12.7 g/dL (ref 11.6–15.9)
Lymphocytes Relative: 43 %
Lymphs Abs: 3.3 10*3/uL (ref 0.9–3.3)
MCH: 28.1 pg (ref 25.1–34.0)
MCHC: 31.4 g/dL — ABNORMAL LOW (ref 31.5–36.0)
MCV: 89.6 fL (ref 79.5–101.0)
Monocytes Absolute: 0.5 10*3/uL (ref 0.1–0.9)
Monocytes Relative: 7 %
NEUTROS ABS: 3.7 10*3/uL (ref 1.5–6.5)
NEUTROS PCT: 48 %
PLATELETS: 229 10*3/uL (ref 145–400)
RBC: 4.52 MIL/uL (ref 3.70–5.45)
RDW: 13.1 % (ref 11.2–14.5)
WBC: 7.7 10*3/uL (ref 3.9–10.3)

## 2017-11-21 LAB — CMP (CANCER CENTER ONLY)
ALT: 44 U/L (ref 0–44)
AST: 39 U/L (ref 15–41)
Albumin: 4 g/dL (ref 3.5–5.0)
Alkaline Phosphatase: 78 U/L (ref 38–126)
Anion gap: 10 (ref 5–15)
BUN: 13 mg/dL (ref 8–23)
CHLORIDE: 105 mmol/L (ref 98–111)
CO2: 25 mmol/L (ref 22–32)
Calcium: 9.4 mg/dL (ref 8.9–10.3)
Creatinine: 0.75 mg/dL (ref 0.44–1.00)
GFR, Estimated: >60 mL/min (ref >60)
Glucose, Bld: 177 mg/dL — ABNORMAL HIGH (ref 70–99)
POTASSIUM: 4 mmol/L (ref 3.5–5.1)
SODIUM: 140 mmol/L (ref 135–145)
Total Bilirubin: 0.4 mg/dL (ref 0.3–1.2)
Total Protein: 6.9 g/dL (ref 6.5–8.1)

## 2017-11-21 NOTE — Therapy (Signed)
[place], [place], [address] Phone: [phone]   Fax:  [phone]  Physical Therapy Evaluation  Patient Details  Name: Rebekah Wells MRN: [id number] Date of Birth: [birth year] Referring Provider (PT): Dr. Stark Klein   Encounter Date: 11/21/2017  PT End of Session - 11/21/17 1534    Visit Number  1    Number of Visits  2    Date for PT Re-Evaluation  01/16/18    PT Start Time  1401    PT Stop Time  1424    PT Time Calculation (min)  23 min    Activity Tolerance  Patient tolerated treatment well    Behavior During Therapy  [facility] for tasks assessed/performed       Past Medical History:  Diagnosis Date  . Arthritis    OA  . Asthma   . Dysrhythmia    tachycardia  . Gastritis   . GERD (gastroesophageal reflux disease)   . H/O seasonal allergies   . Hyperlipemia     Past Surgical History:  Procedure Laterality Date  . ABDOMINAL HYSTERECTOMY    . CARPAL TUNNEL RELEASE Left 07/09/2014   Procedure: LEFT CARPAL TUNNEL RELEASE;  Surgeon: Daryll Brod, MD;  Location: [place];  Service: Orthopedics;  Laterality: Left;  . CESAREAN SECTION    . CHOLECYSTECTOMY    . DG TOES RT FOOT MULTIPLE SPECIF ([place] HX) Right   . DIAGNOSTIC LAPAROSCOPY    . DIGIT NAIL REMOVAL Right 12/05/2016   Procedure: REMOVAL OF SCREW RIGHT GREAT TOE;  Surgeon: Paralee Cancel, MD;  [facility];  Service: Orthopedics;  Laterality: Right;  . EYE SURGERY     bilateral cataract with lens implants  . HAND ARTHROPLASTY Right    for OA  . KNEE ARTHROSCOPY     x3  . NISSEN FUNDOPLICATION    . TONSILLECTOMY    . TOTAL KNEE ARTHROPLASTY Right 12/05/2016   Procedure: RIGHT TOTAL KNEE ARTHROPLASTY;  Surgeon: Paralee Cancel, MD;  [facility];  Service: Orthopedics;  Laterality: Right;  90 mins  . TOTAL KNEE ARTHROPLASTY Left 03/20/2017   Procedure: LEFT TOTAL KNEE ARTHROPLASTY;  Surgeon: Paralee Cancel, MD;   [facility];  Service: Orthopedics;  Laterality: Left;  70 mins    There were no vitals filed for this visit.   Subjective Assessment - 11/21/17 1518    Subjective  Patient reports she is here today to be esen by her medical team for her newly diagnosed right breast cancer.    Patient is accompained by:  Family member    Pertinent History  Patient was diagnosed on 09/27/17 with right grade II invasive ductal carcinoma breast cancer. It measures 9 mm and is located in the lower inner quadrant. It is ER/PR positive and HER2 negative. She had a right total knee replacement in 11/2016 and a left in 02/2017.     Patient Stated Goals  Reduce lymphedema risk and learn post op shoulder ROM HEP    Currently in Pain?  No/denies         [facility] PT Assessment - 11/21/17 0001      Assessment   Medical Diagnosis  Right breast cancer    Referring Provider (PT)  Dr. Stark Klein    Onset Date/Surgical Date  09/27/17    Hand Dominance  Right    Prior Therapy  none      Precautions   Precautions  Other (comment)  Precaution Comments  active cancer      Restrictions   Weight Bearing Restrictions  No      Balance Screen   Has the patient fallen in the past 6 months  No    Has the patient had a decrease in activity level because of a fear of falling?   No    Is the patient reluctant to leave their home because of a fear of falling?   No      Home Film/video editor residence    Living Arrangements  Spouse/significant other    Available Help at Discharge  Family      Prior Function   Level of [place]  Retired    Leisure  She does silver sneakers classes 3x.week for 1 hour and chair yoga once a week      Cognition   Overall Cognitive Status  Within Functional Limits for tasks assessed      Posture/Postural Control   Posture/Postural Control  Postural limitations    Postural Limitations  Rounded Shoulders;Forward head;Increased  thoracic kyphosis      ROM / Strength   AROM / PROM / Strength  AROM;Strength      AROM   AROM Assessment Site  Shoulder;Cervical    Right/Left Shoulder  Right;Left    Right Shoulder Extension  56 Degrees    Right Shoulder Flexion  141 Degrees    Right Shoulder ABduction  160 Degrees    Right Shoulder Internal Rotation  35 Degrees    Right Shoulder External Rotation  75 Degrees    Left Shoulder Extension  55 Degrees    Left Shoulder Flexion  147 Degrees    Left Shoulder ABduction  154 Degrees    Left Shoulder Internal Rotation  74 Degrees    Left Shoulder External Rotation  77 Degrees    Cervical Flexion  WNL    Cervical Extension  WNL    Cervical - Right Side Bend  25% limited    Cervical - Left Side Bend  WNL    Cervical - Right Rotation  WNL    Cervical - Left Rotation  25% limited      Strength   Overall Strength  Within functional limits for tasks performed        LYMPHEDEMA/ONCOLOGY QUESTIONNAIRE - 11/21/17 1523      Type   Cancer Type  Right breast cancer      Lymphedema Assessments   Lymphedema Assessments  Upper extremities      Right Upper Extremity Lymphedema   10 cm Proximal to Olecranon Process  24.7 cm    Olecranon Process  22.6 cm    10 cm Proximal to Ulnar Styloid Process  21.1 cm    Just Proximal to Ulnar Styloid Process  14.2 cm    Across Hand at [employer]  19 cm    At [place] of 2nd Digit  6.3 cm      Left Upper Extremity Lymphedema   10 cm Proximal to Olecranon Process  24.2 cm    Olecranon Process  21.2 cm    10 cm Proximal to Ulnar Styloid Process  19.1 cm    Just Proximal to Ulnar Styloid Process  13.8 cm    Across Hand at [employer]  17.8 cm    At [place] of 2nd Digit  5.7 cm          Quick Dash -  11/21/17 0001    Open a tight or new jar  Severe difficulty    Do heavy household chores (wash walls, wash floors)  Moderate difficulty    Carry a shopping bag or briefcase  Mild difficulty    Wash your back  Mild difficulty     Use a knife to cut food  Moderate difficulty    Recreational activities in which you take some force or impact through your arm, shoulder, or hand (golf, hammering, tennis)  Moderate difficulty    During the past week, to what extent has your arm, shoulder or hand problem interfered with your normal social activities with family, friends, neighbors, or groups?  Slightly    During the past week, to what extent has your arm, shoulder or hand problem limited your work or other regular daily activities  Slightly    Arm, shoulder, or hand pain.  Mild    Tingling (pins and needles) in your arm, shoulder, or hand  None    Difficulty Sleeping  No difficulty    DASH Score  31.82 %        Objective measurements completed on examination: See above findings.      Patient was instructed today in a home exercise program today for post op shoulder range of motion. These included active assist shoulder flexion in sitting, scapular retraction, wall walking with shoulder abduction, and hands behind head external rotation.  She was encouraged to do these twice a day, holding 3 seconds and repeating 5 times when permitted by her physician.     PT Education - 11/21/17 1533    Education Details  Lymphedema risk reduction and post op shoulder ROM HEP    Person(s) Educated  Patient;Spouse    Methods  Explanation;Demonstration;Handout    Comprehension  Returned demonstration;Verbalized understanding          PT Long Term Goals - 11/21/17 1536      PT LONG TERM GOAL #1   Title  Patient will demonstrate she has regained shoulder ROM and strength compared ot baselines measurements when seen post operatively.    Time  [facility] Goals - 11/21/17 1536      Patient will be able to verbalize understanding of pertinent lymphedema risk reduction practices relevant to her diagnosis specifically related to skin care.   Time  1    Status  Achieved      Patient will  be able to return demonstrate and/or verbalize understanding of the post-op home exercise program related to regaining shoulder range of motion.   Time  1    Period  Days    Status  Achieved      Patient will be able to verbalize understanding of the importance of attending the postoperative After Breast Cancer Class for further lymphedema risk reduction education and therapeutic exercise.   Time  1    Period  Days    Status  Achieved            Plan - 11/21/17 1534    Clinical Impression Statement  Patient was diagnosed on 09/27/17 with right grade II invasive ductal carcinoma breast cancer. It measures 9 mm and is located in the lower inner quadrant. It is ER/PR positive and HER2 negative. She had a right total knee replacement in 11/2016 and a left in 02/2017.  Her multidisciplinary medical team met prior to her assessments  to determine a recommended treatment plan. She is planning to have a right lumpectomy and sentinel node biopsy followed by radiation and anti-estrogen therapy. She will benefit from a post op PT visit to reassess and detemine needs.     Clinical Presentation  Stable    Clinical Decision Making  Low    Rehab Potential  Excellent    Clinical Impairments Affecting Rehab Potential  None    PT Frequency  --   Eval and 1 f/u visit   PT Treatment/Interventions  ADLs/Self Care Home Management;Therapeutic exercise;Patient/family education    PT Next Visit Plan  Will reassess 3-4 weeks post op to reassess and determine needs    PT Home Exercise Plan  Post op shoulder ROM HEP    Consulted and Agree with Plan of Care  Patient;Family member/caregiver    Family Member Consulted  Husband       Patient will benefit from skilled therapeutic intervention in order to improve the following deficits and impairments:  Postural dysfunction, Decreased range of motion, Decreased knowledge of precautions, Impaired UE functional use, Pain  Visit Diagnosis: Malignant neoplasm of  lower-inner quadrant of right breast of female, estrogen receptor positive ([place]) - Plan: PT plan of care cert/re-cert  Abnormal posture - Plan: PT plan of care cert/re-cert   Patient will follow up at outpatient cancer rehab 3-4 weeks following surgery.  If the patient requires physical therapy at that time, a specific plan will be dictated and sent to the referring physician for approval. The patient was educated today on appropriate basic range of motion exercises to begin post operatively and the importance of attending the After Breast Cancer class following surgery.  Patient was educated today on lymphedema risk reduction practices as it pertains to recommendations that will benefit the patient immediately following surgery.  She verbalized good understanding.     Problem List Patient Active Problem List   Diagnosis Date Noted  . Malignant neoplasm of lower-inner quadrant of right breast of female, estrogen receptor positive ([place]) 11/16/2017  . Overweight (BMI 25.0-29.9) 03/21/2017  . 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 S/P left TKA 03/20/2017   Annia Friendly, PT 11/21/17 3:39 PM  [place], [place], [address] Phone: [phone]   Fax:  [phone]  Name: Rebekah Wells MRN: [id number] Date of Birth: 01-12-1941

## 2017-11-21 NOTE — Progress Notes (Signed)
Error

## 2017-11-21 NOTE — Progress Notes (Signed)
Nutrition Assessment  Reason for Assessment:  Pt seen in Breast Clinic  ASSESSMENT:   77 year old female with new diagnosis of breast cancer.  Past medical history reviewed.  Patient reports normal appetite  Medications:  reviewed  Labs: glucose 177  Anthropometrics:   Height: 63 inches Weight: 150 lb  BMI: 26   NUTRITION DIAGNOSIS: Food and nutrition related knowledge deficit related to new diagnosis of breast cancer as evidenced by no prior need for nutrition related information.  INTERVENTION:   Discussed and provided packet of information regarding nutritional tips for breast cancer patients.  Questions answered.  Teachback method used.  Contact information provided and patient knows to contact me with questions/concerns.    MONITORING, EVALUATION, and GOAL: Pt will consume a healthy plant based diet to maintain lean body mass throughout treatment.   Shala Baumbach B. Zenia Resides, [place], [place] Registered Dietitian [phone] (pager)

## 2017-11-21 NOTE — Progress Notes (Signed)
Radiation Oncology         [phone] ________________________________  Name: Rebekah Wells        MRN: [id number]  Date of Service: 11/21/2017 DOB: 1940-05-25  CC:Lennox Grumbles, MD  Stark Klein, MD     REFERRING PHYSICIAN: Stark Klein, MD   DIAGNOSIS: The encounter diagnosis was Malignant neoplasm of lower-inner quadrant of right breast of female, estrogen receptor positive ([place]).   HISTORY OF PRESENT ILLNESS: Rebekah Wells is a 77 y.o. female seen in the multidisciplinary breast clinic for a new diagnosis of right breast cancer. The patient was noted to have a screening abnormality with asymmetry in the right breast.  She was seen at [facility] through currently in facility.  In the upper outer quadrant at 10:00 there was a 3 x 8 mm mass seen on diagnostic imaging, and in the lower inner quadrant at 530 and 9 x 9 x 8 mm lesion.  There was also an abnormality in her axillary lymph nodes.  She underwent a biopsy on 10/26/17 in [place], [place].  Final pathology was (also re-read) at the 10 o'clock position revealed revealed benign features but significant fibrosis that appeared concerning and discordant with the original radiologic assessment.  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  No biopsies were obtained of her axillary node.  She comes today to discuss recommendations of treatment.    PREVIOUS RADIATION THERAPY: No   PAST MEDICAL HISTORY:  Past Medical History:  Diagnosis Date  . Arthritis    OA  . Asthma   . Dysrhythmia    tachycardia  . Gastritis   . GERD (gastroesophageal reflux disease)   . H/O seasonal allergies   . Hyperlipemia        PAST SURGICAL HISTORY: Past Surgical History:  Procedure Laterality Date  . ABDOMINAL HYSTERECTOMY    . CARPAL TUNNEL RELEASE Left 07/09/2014   Procedure: LEFT CARPAL TUNNEL RELEASE;  Surgeon: Daryll Brod, MD;  Location: [place];  Service: Orthopedics;  Laterality: Left;  . CESAREAN SECTION    . CHOLECYSTECTOMY    . DG TOES RT FOOT MULTIPLE SPECIF ([place] HX) Right   . DIAGNOSTIC LAPAROSCOPY    . DIGIT NAIL REMOVAL Right 12/05/2016   Procedure: REMOVAL OF SCREW RIGHT GREAT TOE;  Surgeon: Paralee Cancel, MD;  [facility];  Service: Orthopedics;  Laterality: Right;  . EYE SURGERY     bilateral cataract with lens implants  . HAND ARTHROPLASTY Right    for OA  . KNEE ARTHROSCOPY     x3  . NISSEN FUNDOPLICATION    . TONSILLECTOMY    . TOTAL KNEE ARTHROPLASTY Right 12/05/2016   Procedure: RIGHT TOTAL KNEE ARTHROPLASTY;  Surgeon: Paralee Cancel, MD;  [facility];  Service: Orthopedics;  Laterality: Right;  90 mins  . TOTAL KNEE ARTHROPLASTY Left 03/20/2017   Procedure: LEFT TOTAL KNEE ARTHROPLASTY;  Surgeon: Paralee Cancel, MD;  [facility];  Service: Orthopedics;  Laterality: Left;  70 mins     FAMILY HISTORY:  Family History  Problem Relation Age of Onset  . Colon cancer Mother   . CAD Father 60  . Lung cancer Sister      SOCIAL HISTORY:  reports that she has never smoked. She has never used smokeless tobacco. She reports that she does not drink alcohol or use drugs. The patient is married and  lives in [place]. She is accompanied by her husband and daughter, who is a rheumatologist in town.   ALLERGIES: Other; Penicillins; and Sulfa antibiotics   MEDICATIONS:  Current Outpatient Medications  Medication Sig Dispense Refill  . albuterol (PROVENTIL HFA;VENTOLIN HFA) 108 (90 BASE) MCG/ACT inhaler Inhale 1-2 puffs into the lungs every 6 (six) hours as needed for wheezing or shortness of breath.     . beta carotene w/minerals (OCUVITE) tablet Take 1 tablet by mouth daily.    . calcium carbonate (OS-CAL) 600 MG TABS tablet Take 600 mg by mouth daily.     . cetirizine (ZYRTEC) 10 MG tablet Take 10 mg by mouth daily as needed for allergies.     Marland Kitchen docusate sodium (COLACE) 100 MG capsule  Take 1 capsule (100 mg total) by mouth 2 (two) times daily. 10 capsule 0  . Fluticasone-Salmeterol (ADVAIR) 100-50 MCG/DOSE AEPB Inhale 1 puff into the lungs daily.    . metoprolol succinate (TOPROL-XL) 25 MG 24 hr tablet Take 25 mg by mouth daily.    . Multiple Vitamins-Minerals (MULTIVITAMIN PO) Take 1 tablet by mouth daily.    Marland Kitchen omeprazole (PRILOSEC) 20 MG capsule Take 20 mg by mouth daily as needed (for acid reflex).     . simvastatin (ZOCOR) 40 MG tablet Take 40 mg by mouth every evening.     . traZODone (DESYREL) 50 MG tablet Take 25 mg by mouth at bedtime as needed for sleep.     No current facility-administered medications for this encounter.      REVIEW OF SYSTEMS: On review of systems, the patient reports that she is doing well overall. She denies any chest pain, shortness of breath, cough, fevers, chills, night sweats, unintended weight changes. She denies any bowel or bladder disturbances, and denies abdominal pain, nausea or vomiting. She denies any new musculoskeletal or joint aches or pains. A complete review of systems is obtained and is otherwise negative.     PHYSICAL EXAM:  Wt Readings from Last 3 Encounters:  11/21/17 150 lb (68 kg)  03/20/17 147 lb (66.7 kg)  03/02/17 147 lb 6 oz (66.8 kg)   Temp Readings from Last 3 Encounters:  11/21/17 98 F (36.7 C) (Oral)  03/22/17 98.8 F (37.1 C) (Oral)  03/02/17 98.1 F (36.7 C) (Oral)   BP Readings from Last 3 Encounters:  11/21/17 123/73  03/22/17 119/63  03/02/17 126/81   Pulse Readings from Last 3 Encounters:  11/21/17 (!) 104  03/22/17 89  03/02/17 88     In general this is a well appearing caucasian female in no acute distress. She is alert and oriented x4 and appropriate throughout the examination. HEENT reveals that the patient is normocephalic, atraumatic. EOMs are intact. Skin is intact without any evidence of gross lesions. Cardiopulmonary assessment is negative for acute distress and she exhibits  normal effort. Breast exam is deferred.   ECOG = 0  0 - Asymptomatic (Fully active, able to carry on all predisease activities without restriction)  1 - Symptomatic but completely ambulatory (Restricted in physically strenuous activity but ambulatory and able to carry out work of a light or sedentary nature. For example, light housework, office work)  2 - Symptomatic, <50% in bed during the day (Ambulatory and capable of all self care but unable to carry out any work activities. Up and about more than 50% of waking hours)  3 - Symptomatic, >50% in bed, but not bedbound (Capable of only limited self-care, confined to bed or  chair 50% or more of waking hours)  4 - Bedbound (Completely disabled. Cannot carry on any self-care. Totally confined to bed or chair)  5 - Death   Eustace Pen MM, Creech RH, Tormey DC, et al. [id number]). "Toxicity and response criteria of the [facility] Group". [place] Oncol. 5 (6): 649-55    LABORATORY DATA:  Lab Results  Component Value Date   WBC 7.7 11/21/2017   HGB 12.7 11/21/2017   HCT 40.5 11/21/2017   MCV 89.6 11/21/2017   PLT 229 11/21/2017   Lab Results  Component Value Date   NA 140 11/21/2017   K 4.0 11/21/2017   CL 105 11/21/2017   CO2 25 11/21/2017   Lab Results  Component Value Date   ALT 44 11/21/2017   AST 39 11/21/2017   ALKPHOS 78 11/21/2017   BILITOT 0.4 11/21/2017      RADIOGRAPHY: No results found.     IMPRESSION/PLAN: 1. Stage IA-B, cT1cN0-1,M0, grade 2 invasive ductal carcinoma of the right breast. Dr. Lisbeth Renshaw discusses the pathology findings and reviews the nature of invasive ductal breast disease. The consensus from the breast conference includes an MRI of bilateral breasts for extent of her calcifications and fibrotic changes and will also further evaluate the possible abnormal node. She may need additional biopsy of the 13 mm lesion at the 10:00 position, as the concern in conference is that this could be  discordant. She may also need a biopsy of the axillary node. Based on her anatomy, she would likely still be a candidate for conservation surgery with lumpectomy and sentinel node biopsy. She may  also benefit from adjuvant radiotherapy and antiestrogen therapy. We discussed the risks, benefits, short, and long term effects of radiotherapy. Dr. Lisbeth Renshaw discusses the delivery and logistics of radiotherapy and would anticipate a course of 4 or 6 1/2 weeks of radiotherapy. We will see her back once there is clarification of the upper outer quadrant 13 mm lesion as well as the node. We anticipate she'd then undergo surgical resection and that we would see her about 2 weeks after surgery to discuss the simulation process and anticipate we starting radiotherapy about 4-6 weeks after surgery.   In a visit lasting 45 minutes, greater than 50% of the time was spent face to face discussing her case, and coordinating the patient's care.   The above documentation reflects my direct findings during this shared patient visit. Please see the separate note by Dr. Lisbeth Renshaw on this date for the remainder of the patient's plan of care.    Carola Rhine, PAC

## 2017-11-21 NOTE — Patient Instructions (Signed)

## 2017-11-22 ENCOUNTER — Telehealth: Payer: Self-pay | Admitting: Oncology

## 2017-11-22 NOTE — Telephone Encounter (Signed)
Scheduled appt per 10/2 sch message - left message with appt date and time and sent reminder letter in the mail

## 2017-11-23 NOTE — Progress Notes (Signed)
[place] Psychosocial Distress Screening Spiritual Care  Met with Rebekah Wells in Breast Multidisciplinary Clinic to introduce [place] team/resources, reviewing distress screen per protocol.  The patient scored a 5 on the Psychosocial Distress Thermometer which indicates moderate distress. Also assessed for distress and other psychosocial needs.   ONCBCN DISTRESS SCREENING 11/23/2017  Screening Type Initial Screening  Distress experienced in past week (1-10) 5  Information Concerns Type Lack of info about diagnosis;Lack of info about treatment;Lack of info about complementary therapy choices;Lack of info about maintaining fitness  Physical Problem type Sleep/insomnia;Skin dry/itchy  Referral to support programs Yes   The patient presented to Breast Clinic with her daughter and husband. She expressed that she feels distressed about unknowns related to her diagnosis, however some of her distress was reduced by learning more information. The patient shared that life changes, such as commuting from [place], will bring challenges. However, the patient expressed that she feels well supported. The counselor shared information about Patient and Family Support resources with the patient, and the patient expressed interest in the Aquatic Wellness class. At this time, the patient reported that she does not need any other resources, but she will reach out to Patient and Family Support if any needs arise.  Follow up needed: No.  Doris Cheadle, Counseling Intern [phone]

## 2017-11-28 ENCOUNTER — Ambulatory Visit (HOSPITAL_COMMUNITY)
Admission: RE | Admit: 2017-11-28 | Discharge: 2017-11-28 | Disposition: A | Discharge status: Home / Self Care | Payer: Medicare Other | Source: Ambulatory Visit | Attending: General Surgery | Admitting: General Surgery

## 2017-11-28 DIAGNOSIS — Z170 Estrogen receptor positive status [ER+]: Secondary | ICD-10-CM | POA: Diagnosis present

## 2017-11-28 DIAGNOSIS — C50311 Malignant neoplasm of lower-inner quadrant of right female breast: Secondary | ICD-10-CM

## 2017-11-28 MED ORDER — GADOBUTROL 1 MMOL/ML IV SOLN
7.0000 mL | Freq: Once | INTRAVENOUS | Status: AC | PRN
  Administered 2017-11-28: 7 mL via INTRAVENOUS

## 2017-11-29 ENCOUNTER — Telehealth: Payer: Self-pay | Admitting: *Deleted

## 2017-11-29 NOTE — Telephone Encounter (Signed)
Attempted to call patient at her new cell phone number but no answer and I did not leave voicemail because patient does not use voicemail. Left message with her daughter Janace Hoard to let me know if there are any concerns or questions.

## 2017-11-29 NOTE — Telephone Encounter (Signed)
This RN received call from pt stating " I received a call from you and am returning it - I think it is about the MRI I had done"  Call note entered from navigator re above.  This RN informed pt of results with pt verbalizing appreciation.  Pt stated " I will wait now to hear from the surgeon "  No further questions or needs at this time.  This note will be forwarded to [facility] breast navigator.

## 2017-11-30 ENCOUNTER — Other Ambulatory Visit: Payer: Self-pay | Admitting: General Surgery

## 2017-11-30 ENCOUNTER — Other Ambulatory Visit: Payer: Self-pay | Admitting: *Deleted

## 2017-11-30 DIAGNOSIS — Z170 Estrogen receptor positive status [ER+]: Principal | ICD-10-CM

## 2017-11-30 DIAGNOSIS — C50311 Malignant neoplasm of lower-inner quadrant of right female breast: Secondary | ICD-10-CM

## 2017-11-30 NOTE — Progress Notes (Signed)
Let patient know no concerning areas other than the known cancer.  Surgery orders placed.

## 2017-11-30 NOTE — Progress Notes (Signed)
bone

## 2017-12-03 ENCOUNTER — Encounter: Payer: Self-pay | Admitting: *Deleted

## 2017-12-03 ENCOUNTER — Ambulatory Visit
Admission: RE | Admit: 2017-12-03 | Discharge: 2017-12-03 | Disposition: A | Discharge status: Home / Self Care | Payer: Medicare Other | Source: Ambulatory Visit | Attending: General Surgery | Admitting: General Surgery

## 2017-12-03 DIAGNOSIS — Z170 Estrogen receptor positive status [ER+]: Principal | ICD-10-CM

## 2017-12-03 DIAGNOSIS — C50311 Malignant neoplasm of lower-inner quadrant of right female breast: Secondary | ICD-10-CM

## 2017-12-03 NOTE — H&P (Signed)
Rebekah Wells Documented: 11/21/2017 9:22 AM Location: [facility] Patient #: [id number] DOB: 04/19/1940 Undefined / Language: English / Race: White Female   History of Present Illness (Zacari Stiff MD; 11/21/2017 4:53 PM) The patient is a 76 year old female who presents with breast cancer. Pt is a 76 yo F who was diagnosed with right breast cancer 10/2017 in [facility]. She had screening detected asymmetry with breast density C. She underwent diagnostic imaging that showed a 13 mm mass at 10 o'clock and a 9 mm mass at 5:30 position on the right. core needle biopsies were performed of both masses. There was also a upper limit normal lymph node in the right axilla whose cortex was slightly thickened. 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 No Ki 67 was available. Her daughter is rheumatologist Dr. Klontz, and she came here for second opinion.   Her pathology was reviewed here, and the cancer was in a background of dense fibrosis, so from a pathology standpoint, the benign diagnosis of the upper outer quadrant lesion is in question. The patient has multiple family members with cancer. Her mother had colon cancer, sister had lung cancer. Another sister had CLL, a grandmother had a leg sarcoma, and a grandfather had colon cancer. She had menarche at age 11. She had surgical menopause. She used HRT for 10 years. she is a G4P4 with first child at age 27. She used hormonal contraception for around 1 year. She is up to date with colonoscopy. Bone density has not been done for around 10 years.   pathology 10/24/2017 Diagnosis 1. Consult Slide , Right Breast @ 10 o'clock - BENIGN BREAST TISSUE WITH DENSE FIBROSIS - NO MALIGNANCY IDENTIFIED 2. Consult Slide , Right Breast @ 5:30 - INVASIVE DUCTAL CARCINOMA - CALCIFICATIONS - SEE COMMENT Microscopic Comment 2. Based on  the biopsy, the carcinoma appears Nottingham grade 2 of 3 and measures 0.7 cm in greatest linear extent. Outside immunohistochemistry was performed and is reviewed. The tumor is positive for ER (100%; strong) and PR (40%; moderate) and negative for SMM. Per report, Her2 is negative by FISH.  CMET, CBC essentially normal other than glucose 177.  PMH positive for osteoarthritis, asthma, high cholesterol, seasonal allergies, and palpitations.   PSH positive for 2 knee surgeries, cholecystectomy, carpal tunnel, c section, nissen, and hysterectomy  SH non smoker, non drinker, no illicit drugs.     Review of Systems (Margues Filippini MD; 11/21/2017 4:44 PM)  Note: ROS positive for dentures and glasses. + arthritis. otherwise negative x 15 systems   Vitals (Taygen Newsome MD; 11/21/2017 4:38 PM) 11/21/2017 4:38 PM Weight: 150 lb Height: 63in Body Surface Area: 1.71 m Body Mass Index: 26.57 kg/m  Temp.: 98F  Pulse: 104 (Regular)  Resp.: 18 (Unlabored)  BP: 123/73 (Sitting, Left Arm, Standard)       Physical Exam (Meliss Fleek MD; 11/21/2017 4:46 PM) General Mental Status-Alert. General Appearance-Consistent with stated age. Hydration-Well hydrated. Voice-Normal.  Head and Neck Head-normocephalic, atraumatic with no lesions or palpable masses. Trachea-midline. Thyroid Gland Characteristics - normal size and consistency.  Eye Eyeball - Bilateral-Extraocular movements intact. Sclera/Conjunctiva - Bilateral-No scleral icterus.  Chest and Lung Exam Chest and lung exam reveals -quiet, even and easy respiratory effort with no use of accessory muscles and on auscultation, normal breath sounds, no adventitious sounds and normal vocal resonance. Inspection Chest Wall - Normal. Back - normal.    Breast Note: ptotic breasts bilaterally that are relatively symmetric. No palpable masses. Some faint bruising on lower inner right breast. No LAD. no nipple  retraction or nipple discharge. left breast normal.   Cardiovascular Cardiovascular examination reveals -normal heart sounds, regular rate and rhythm with no murmurs and normal pedal pulses bilaterally.  Abdomen Inspection Inspection of the abdomen reveals - No Hernias. Palpation/Percussion Palpation and Percussion of the abdomen reveal - Soft, Non Tender, No Rebound tenderness, No Rigidity (guarding) and No hepatosplenomegaly. Auscultation Auscultation of the abdomen reveals - Bowel sounds normal.  Neurologic Neurologic evaluation reveals -alert and oriented x 3 with no impairment of recent or remote memory. Mental Status-Normal.  Musculoskeletal Global Assessment -Note: no gross deformities.  Normal Exam - Left-Upper Extremity Strength Normal and Lower Extremity Strength Normal. Normal Exam - Right-Upper Extremity Strength Normal and Lower Extremity Strength Normal.  Lymphatic Head & Neck  General Head & Neck Lymphatics: Bilateral - Description - Normal. Axillary  General Axillary Region: Bilateral - Description - Normal. Tenderness - Non Tender. Femoral & Inguinal  Generalized Femoral & Inguinal Lymphatics: Bilateral - Description - No Generalized lymphadenopathy.    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 If the upper outer quadrant lesion looks abnormal, will repeat core needle biopsy of this. This will also allow a second look at the lymph nodes. If there is truly an abnormal node, will get core needle biopsy of this as well.  At the very least, will need seed localized lumpectomy and sentinel lymph node biopsy. If the upper outer quadrant mass remains discordant, will localize this with seed as well.  Similarly, if she ends  up having a positive axillary lymph node, will plan seed localization of the node and a targeted node dissection.  Thsi would be followed by radiation and antiestrogen treatment. FAMILY HISTORY OF CANCER (Z80.9) Impression: Genetics referral.    Signed by Deegan Valentino, MD (11/21/2017 4:54 PM) 

## 2017-12-04 NOTE — Pre-Procedure Instructions (Signed)
Rebekah Wells  12/04/2017      [id number] [address], [place] [address] Phone: [phone] Fax: [phone]    Your procedure is scheduled on Thursday, October 17th.  Report to [facility] Admitting at 6:45 A.M.  Call this number if you have problems the morning of surgery:  [phone]   Remember:  Do not eat after midnight.  You may drink clear liquids until 5:45 A.M .  Clear liquids allowed are: Water, Juice (non-citric and without pulp), Carbonated beverages, Clear Tea, Black Coffee only and Gatorade    Take these medicines the morning of surgery with A SIP OF WATER  Inhalers-please bring rescue inhaler with you to hospital.  cetirizine (ZYRTEC)-as needed omeprazole (PRILOSEC)-as needed   STOP taking any Aspirin, Aleve, Naproxen, meloxicam, Ibuprofen, Motrin, Advil, Goody's, BC's, all herbal medications, fish oil, and all vitamins.   Do not wear jewelry, make-up or nail polish.  Do not wear lotions, powders, or perfumes, or deodorant.  Do not shave 48 hours prior to surgery.    Do not bring valuables to the hospital.  [facility] is not responsible for any belongings or valuables.  Contacts, dentures or bridgework may not be worn into surgery.  Leave your suitcase in the car.  After surgery it may be brought to your room.  For patients admitted to the hospital, discharge time will be determined by your treatment team.  Patients discharged the day of surgery will not be allowed to drive home.   Special instructions:   [place]- Preparing For Surgery  Before surgery, you can play an important role. Because skin is not sterile, your skin needs to be as free of germs as possible. You can reduce the number of germs on your skin by washing with CHG (chlorahexidine gluconate) Soap before surgery.  CHG is an antiseptic cleaner which kills germs and bonds with the skin to continue killing germs even  after washing.    Oral Hygiene is also important to reduce your risk of infection.  Remember - BRUSH YOUR TEETH THE MORNING OF SURGERY WITH YOUR REGULAR TOOTHPASTE  Please do not use if you have an allergy to CHG or antibacterial soaps. If your skin becomes reddened/irritated stop using the CHG.  Do not shave (including legs and underarms) for at least 48 hours prior to first CHG shower. It is OK to shave your face.  Please follow these instructions carefully.   1. Shower the NIGHT BEFORE SURGERY and the MORNING OF SURGERY with CHG.   2. If you chose to wash your hair, wash your hair first as usual with your normal shampoo.  3. After you shampoo, rinse your hair and body thoroughly to remove the shampoo.  4. Use CHG as you would any other liquid soap. You can apply CHG directly to the skin and wash gently with a scrungie or a clean washcloth.   5. Apply the CHG Soap to your body ONLY FROM THE NECK DOWN.  Do not use on open wounds or open sores. Avoid contact with your eyes, ears, mouth and genitals (private parts). Wash Face and genitals (private parts)  with your normal soap.  6. Wash thoroughly, paying special attention to the area where your surgery will be performed.  7. Thoroughly rinse your body with warm water from the neck down.  8. DO NOT shower/wash with your normal soap after using and rinsing off the CHG  Soap.  9. Pat yourself dry with a CLEAN TOWEL.  10. Wear CLEAN PAJAMAS to bed the night before surgery, wear comfortable clothes the morning of surgery  11. Place CLEAN SHEETS on your bed the night of your first shower and DO NOT SLEEP WITH PETS.    Day of Surgery:  Do not apply any deodorants/lotions.  Please wear clean clothes to the hospital/surgery center.   Remember to brush your teeth WITH YOUR REGULAR TOOTHPASTE.  Please read over the following fact sheets that you were given.

## 2017-12-05 ENCOUNTER — Ambulatory Visit
Admission: RE | Admit: 2017-12-05 | Discharge: 2017-12-05 | Disposition: A | Discharge status: Home / Self Care | Payer: Medicare Other | Source: Ambulatory Visit | Attending: Oncology | Admitting: Oncology

## 2017-12-05 ENCOUNTER — Other Ambulatory Visit: Payer: Self-pay

## 2017-12-05 ENCOUNTER — Encounter (HOSPITAL_COMMUNITY)
Admission: RE | Admit: 2017-12-05 | Discharge: 2017-12-05 | Disposition: A | Discharge status: Home / Self Care | Payer: Medicare Other | Source: Ambulatory Visit | Attending: General Surgery | Admitting: General Surgery

## 2017-12-05 ENCOUNTER — Ambulatory Visit
Admission: RE | Admit: 2017-12-05 | Discharge: 2017-12-05 | Disposition: A | Discharge status: Home / Self Care | Payer: Medicare Other | Source: Ambulatory Visit | Attending: General Surgery | Admitting: General Surgery

## 2017-12-05 ENCOUNTER — Encounter (HOSPITAL_COMMUNITY): Payer: Self-pay

## 2017-12-05 DIAGNOSIS — Z809 Family history of malignant neoplasm, unspecified: Secondary | ICD-10-CM | POA: Diagnosis not present

## 2017-12-05 DIAGNOSIS — Z170 Estrogen receptor positive status [ER+]: Principal | ICD-10-CM

## 2017-12-05 DIAGNOSIS — C50311 Malignant neoplasm of lower-inner quadrant of right female breast: Secondary | ICD-10-CM | POA: Diagnosis present

## 2017-12-05 DIAGNOSIS — Z79899 Other long term (current) drug therapy: Secondary | ICD-10-CM | POA: Diagnosis not present

## 2017-12-05 DIAGNOSIS — F329 Major depressive disorder, single episode, unspecified: Secondary | ICD-10-CM | POA: Diagnosis not present

## 2017-12-05 HISTORY — DX: Malignant (primary) neoplasm, unspecified: C80.1

## 2017-12-05 LAB — BASIC METABOLIC PANEL
Anion gap: 9 (ref 5–15)
BUN: 13 mg/dL (ref 8–23)
CALCIUM: 9 mg/dL (ref 8.9–10.3)
CO2: 22 mmol/L (ref 22–32)
Chloride: 108 mmol/L (ref 98–111)
Creatinine, Ser: 0.75 mg/dL (ref 0.44–1.00)
GFR calc Af Amer: >60 mL/min (ref >60)
GLUCOSE: 183 mg/dL — AB (ref 70–99)
Potassium: 3.7 mmol/L (ref 3.5–5.1)
Sodium: 139 mmol/L (ref 135–145)

## 2017-12-05 LAB — CBC
HCT: 43.2 % (ref 36.0–46.0)
Hemoglobin: 12.8 g/dL (ref 12.0–15.0)
MCH: 27.2 pg (ref 26.0–34.0)
MCHC: 29.6 g/dL — AB (ref 30.0–36.0)
MCV: 91.7 fL (ref 80.0–100.0)
PLATELETS: 259 10*3/uL (ref 150–400)
RBC: 4.71 MIL/uL (ref 3.87–5.11)
RDW: 12.4 % (ref 11.5–15.5)
WBC: 6.9 10*3/uL (ref 4.0–10.5)
nRBC: 0 % (ref 0.0–0.2)

## 2017-12-05 NOTE — Progress Notes (Signed)
PCP: Domingo Pulse, MD  Cardiologist: Pt denies  EKG: pt denies past year, obtained today  Stress test: pt denies  ECHO: pt denies  Cardiac Cath: pt denies  Chest x-ray: pt denies past year, no recent respiratory infections/complications

## 2017-12-05 NOTE — Anesthesia Preprocedure Evaluation (Addendum)
Anesthesia Evaluation  Patient identified by MRN, date of birth, ID band Patient awake    Reviewed: Allergy & Precautions, NPO status , Patient's Chart, lab work & pertinent test results, reviewed documented beta blocker date and time   Airway Mallampati: I       Dental no notable dental hx. (+) Teeth Intact   Pulmonary    Pulmonary exam normal breath sounds clear to auscultation       Cardiovascular Normal cardiovascular exam+ dysrhythmias Supra Ventricular Tachycardia  Rhythm:Regular Rate:Normal     Neuro/Psych Depression negative neurological ROS     GI/Hepatic Neg liver ROS, GERD  Medicated,  Endo/Other  negative endocrine ROS  Renal/GU negative Renal ROS  negative genitourinary   Musculoskeletal negative musculoskeletal ROS (+)   Abdominal Normal abdominal exam  (+)   Peds negative pediatric ROS (+)  Hematology negative hematology ROS (+)   Anesthesia Other Findings   Reproductive/Obstetrics negative OB ROS                            Anesthesia Physical  Anesthesia Plan  ASA: II  Anesthesia Plan: General   Post-op Pain Management:  Regional for Post-op pain   Induction: Intravenous  PONV Risk Score and Plan: 3 and Dexamethasone, Ondansetron and Treatment may vary due to age or medical condition  Airway Management Planned: LMA  Additional Equipment:   Intra-op Plan:   Post-operative Plan: Extubation in OR  Informed Consent: I have reviewed the patients History and Physical, chart, labs and discussed the procedure including the risks, benefits and alternatives for the proposed anesthesia with the patient or authorized representative who has indicated his/her understanding and acceptance.   Dental advisory given  Plan Discussed with: CRNA and Surgeon  Anesthesia Plan Comments:        Anesthesia Quick Evaluation

## 2017-12-06 ENCOUNTER — Ambulatory Visit (HOSPITAL_COMMUNITY)
Admission: RE | Admit: 2017-12-06 | Discharge: 2017-12-06 | Disposition: A | Discharge status: Home / Self Care | Payer: Medicare Other | Source: Ambulatory Visit | Attending: General Surgery | Admitting: General Surgery

## 2017-12-06 ENCOUNTER — Ambulatory Visit (HOSPITAL_COMMUNITY): Payer: Medicare Other | Admitting: Anesthesiology

## 2017-12-06 ENCOUNTER — Encounter (HOSPITAL_COMMUNITY)
Admission: RE | Admit: 2017-12-06 | Discharge: 2017-12-06 | Disposition: A | Discharge status: Home / Self Care | Payer: Medicare Other | Source: Ambulatory Visit | Attending: General Surgery | Admitting: General Surgery

## 2017-12-06 ENCOUNTER — Encounter (HOSPITAL_COMMUNITY)
Admission: RE | Disposition: A | Discharge status: Home / Self Care | Payer: Self-pay | Source: Ambulatory Visit | Attending: General Surgery

## 2017-12-06 ENCOUNTER — Ambulatory Visit
Admission: RE | Admit: 2017-12-06 | Discharge: 2017-12-06 | Disposition: A | Discharge status: Home / Self Care | Payer: Medicare Other | Source: Ambulatory Visit | Attending: General Surgery | Admitting: General Surgery

## 2017-12-06 ENCOUNTER — Encounter (HOSPITAL_COMMUNITY): Payer: Self-pay

## 2017-12-06 DIAGNOSIS — Z79899 Other long term (current) drug therapy: Secondary | ICD-10-CM | POA: Insufficient documentation

## 2017-12-06 DIAGNOSIS — Z170 Estrogen receptor positive status [ER+]: Principal | ICD-10-CM

## 2017-12-06 DIAGNOSIS — Z809 Family history of malignant neoplasm, unspecified: Secondary | ICD-10-CM | POA: Diagnosis not present

## 2017-12-06 DIAGNOSIS — C50311 Malignant neoplasm of lower-inner quadrant of right female breast: Secondary | ICD-10-CM | POA: Insufficient documentation

## 2017-12-06 DIAGNOSIS — F329 Major depressive disorder, single episode, unspecified: Secondary | ICD-10-CM | POA: Diagnosis not present

## 2017-12-06 HISTORY — PX: BREAST LUMPECTOMY WITH RADIOACTIVE SEED AND SENTINEL LYMPH NODE BIOPSY: SHX6550

## 2017-12-06 SURGERY — BREAST LUMPECTOMY WITH RADIOACTIVE SEED AND SENTINEL LYMPH NODE BIOPSY
Anesthesia: General | Site: Breast | Laterality: Right

## 2017-12-06 MED ORDER — IBUPROFEN 100 MG/5ML PO SUSP
200.0000 mg | Freq: Four times a day (QID) | ORAL | Status: DC | PRN
  Filled 2017-12-06: qty 20

## 2017-12-06 MED ORDER — ACETAMINOPHEN 500 MG PO TABS
1000.0000 mg | ORAL_TABLET | ORAL | Status: AC
  Administered 2017-12-06: 1000 mg via ORAL
  Filled 2017-12-06: qty 2

## 2017-12-06 MED ORDER — ONDANSETRON HCL 4 MG/2ML IJ SOLN
INTRAMUSCULAR | Status: AC
  Filled 2017-12-06: qty 2

## 2017-12-06 MED ORDER — TECHNETIUM TC 99M SULFUR COLLOID FILTERED
1.0000 | Freq: Once | INTRAVENOUS | Status: AC | PRN
  Administered 2017-12-06: 1 via INTRADERMAL

## 2017-12-06 MED ORDER — MIDAZOLAM HCL 2 MG/2ML IJ SOLN
INTRAMUSCULAR | Status: AC
  Administered 2017-12-06: 2 mg via INTRAVENOUS
  Filled 2017-12-06: qty 2

## 2017-12-06 MED ORDER — IBUPROFEN 200 MG PO TABS
200.0000 mg | ORAL_TABLET | Freq: Four times a day (QID) | ORAL | Status: DC | PRN
  Filled 2017-12-06: qty 2

## 2017-12-06 MED ORDER — PHENYLEPHRINE 40 MCG/ML (10ML) SYRINGE FOR IV PUSH (FOR BLOOD PRESSURE SUPPORT)
PREFILLED_SYRINGE | INTRAVENOUS | Status: AC
  Filled 2017-12-06: qty 10

## 2017-12-06 MED ORDER — LIDOCAINE HCL 1 % IJ SOLN
INTRAMUSCULAR | Status: DC | PRN
  Administered 2017-12-06: 50 mL via INTRAMUSCULAR

## 2017-12-06 MED ORDER — DIPHENHYDRAMINE HCL 50 MG/ML IJ SOLN
INTRAMUSCULAR | Status: AC
  Filled 2017-12-06: qty 1

## 2017-12-06 MED ORDER — CHLORHEXIDINE GLUCONATE CLOTH 2 % EX PADS: 6.0000 | MEDICATED_PAD | Freq: Once | CUTANEOUS | Status: DC

## 2017-12-06 MED ORDER — BUPIVACAINE-EPINEPHRINE (PF) 0.25% -1:200000 IJ SOLN
INTRAMUSCULAR | Status: AC
  Filled 2017-12-06: qty 30

## 2017-12-06 MED ORDER — LIDOCAINE 2% (20 MG/ML) 5 ML SYRINGE
INTRAMUSCULAR | Status: AC
  Filled 2017-12-06: qty 5

## 2017-12-06 MED ORDER — FENTANYL CITRATE (PF) 100 MCG/2ML IJ SOLN
INTRAMUSCULAR | Status: AC
  Administered 2017-12-06: 100 ug via INTRAVENOUS
  Filled 2017-12-06: qty 2

## 2017-12-06 MED ORDER — FENTANYL CITRATE (PF) 250 MCG/5ML IJ SOLN
INTRAMUSCULAR | Status: DC | PRN
  Administered 2017-12-06: 25 ug via INTRAVENOUS
  Administered 2017-12-06: 50 ug via INTRAVENOUS

## 2017-12-06 MED ORDER — METHYLENE BLUE 0.5 % INJ SOLN
INTRAVENOUS | Status: AC
  Filled 2017-12-06: qty 10

## 2017-12-06 MED ORDER — DEXAMETHASONE SODIUM PHOSPHATE 10 MG/ML IJ SOLN
INTRAMUSCULAR | Status: DC | PRN
  Administered 2017-12-06: 10 mg via INTRAVENOUS

## 2017-12-06 MED ORDER — LACTATED RINGERS IV SOLN
INTRAVENOUS | Status: DC
  Administered 2017-12-06 (×2): via INTRAVENOUS

## 2017-12-06 MED ORDER — ONDANSETRON HCL 4 MG/2ML IJ SOLN: 4.0000 mg | Freq: Once | INTRAMUSCULAR | Status: DC | PRN

## 2017-12-06 MED ORDER — LIDOCAINE HCL (PF) 1 % IJ SOLN
INTRAMUSCULAR | Status: AC
  Filled 2017-12-06: qty 30

## 2017-12-06 MED ORDER — GABAPENTIN 300 MG PO CAPS
300.0000 mg | ORAL_CAPSULE | ORAL | Status: AC
  Administered 2017-12-06: 300 mg via ORAL
  Filled 2017-12-06: qty 1

## 2017-12-06 MED ORDER — DEXAMETHASONE SODIUM PHOSPHATE 10 MG/ML IJ SOLN
INTRAMUSCULAR | Status: AC
  Filled 2017-12-06: qty 1

## 2017-12-06 MED ORDER — OXYCODONE HCL 5 MG PO TABS: 5.0000 mg | ORAL_TABLET | Freq: Once | ORAL | Status: DC | PRN

## 2017-12-06 MED ORDER — FENTANYL CITRATE (PF) 100 MCG/2ML IJ SOLN: 25.0000 ug | INTRAMUSCULAR | Status: DC | PRN

## 2017-12-06 MED ORDER — PROPOFOL 500 MG/50ML IV EMUL
INTRAVENOUS | Status: DC | PRN
  Administered 2017-12-06: 20 ug/kg/min via INTRAVENOUS

## 2017-12-06 MED ORDER — DIPHENHYDRAMINE HCL 50 MG/ML IJ SOLN
INTRAMUSCULAR | Status: DC | PRN
  Administered 2017-12-06: 12.5 mg via INTRAVENOUS

## 2017-12-06 MED ORDER — OXYCODONE HCL 5 MG/5ML PO SOLN: 5.0000 mg | Freq: Once | ORAL | Status: DC | PRN

## 2017-12-06 MED ORDER — MEPERIDINE HCL 50 MG/ML IJ SOLN: 6.2500 mg | INTRAMUSCULAR | Status: DC | PRN

## 2017-12-06 MED ORDER — ROPIVACAINE HCL 5 MG/ML IJ SOLN
INTRAMUSCULAR | Status: DC | PRN
  Administered 2017-12-06 (×6): 5 mL via PERINEURAL

## 2017-12-06 MED ORDER — KETOROLAC TROMETHAMINE 15 MG/ML IJ SOLN: 15.0000 mg | Freq: Once | INTRAMUSCULAR | Status: DC

## 2017-12-06 MED ORDER — PROPOFOL 10 MG/ML IV BOLUS
INTRAVENOUS | Status: AC
  Filled 2017-12-06: qty 20

## 2017-12-06 MED ORDER — MIDAZOLAM HCL 2 MG/2ML IJ SOLN
2.0000 mg | Freq: Once | INTRAMUSCULAR | Status: AC
  Administered 2017-12-06: 2 mg via INTRAVENOUS

## 2017-12-06 MED ORDER — HYDROCODONE-ACETAMINOPHEN 5-325 MG PO TABS: 1.0000 | ORAL_TABLET | Freq: Four times a day (QID) | ORAL | 0 refills | Status: DC | PRN

## 2017-12-06 MED ORDER — FENTANYL CITRATE (PF) 250 MCG/5ML IJ SOLN
INTRAMUSCULAR | Status: AC
  Filled 2017-12-06: qty 5

## 2017-12-06 MED ORDER — LIDOCAINE 2% (20 MG/ML) 5 ML SYRINGE
INTRAMUSCULAR | Status: DC | PRN
  Administered 2017-12-06: 100 mg via INTRAVENOUS

## 2017-12-06 MED ORDER — 0.9 % SODIUM CHLORIDE (POUR BTL) OPTIME
TOPICAL | Status: DC | PRN
  Administered 2017-12-06 (×2): 1000 mL

## 2017-12-06 MED ORDER — ONDANSETRON HCL 4 MG/2ML IJ SOLN
INTRAMUSCULAR | Status: DC | PRN
  Administered 2017-12-06: 4 mg via INTRAVENOUS

## 2017-12-06 MED ORDER — PROPOFOL 10 MG/ML IV BOLUS
INTRAVENOUS | Status: DC | PRN
  Administered 2017-12-06: 150 mg via INTRAVENOUS

## 2017-12-06 MED ORDER — CIPROFLOXACIN IN D5W 400 MG/200ML IV SOLN
400.0000 mg | INTRAVENOUS | Status: AC
  Administered 2017-12-06: 400 mg via INTRAVENOUS
  Filled 2017-12-06: qty 200

## 2017-12-06 MED ORDER — FENTANYL CITRATE (PF) 100 MCG/2ML IJ SOLN
100.0000 ug | Freq: Once | INTRAMUSCULAR | Status: AC
  Administered 2017-12-06: 100 ug via INTRAVENOUS

## 2017-12-06 MED ORDER — EPHEDRINE SULFATE-NACL 50-0.9 MG/10ML-% IV SOSY
PREFILLED_SYRINGE | INTRAVENOUS | Status: DC | PRN
  Administered 2017-12-06: 10 mg via INTRAVENOUS
  Administered 2017-12-06: 5 mg via INTRAVENOUS
  Administered 2017-12-06: 10 mg via INTRAVENOUS

## 2017-12-06 SURGICAL SUPPLY — 52 items
BENZOIN TINCTURE PRP APPL 2/3 (GAUZE/BANDAGES/DRESSINGS) ×2 IMPLANT
BINDER BREAST XLRG (GAUZE/BANDAGES/DRESSINGS) ×2 IMPLANT
BNDG COHESIVE 4X5 TAN STRL (GAUZE/BANDAGES/DRESSINGS) ×2 IMPLANT
CANISTER SUCT 3000ML PPV (MISCELLANEOUS) ×2 IMPLANT
CHLORAPREP W/TINT 26ML (MISCELLANEOUS) ×2 IMPLANT
CLIP VESOCCLUDE LG 6/CT (CLIP) ×2 IMPLANT
CLIP VESOCCLUDE MED 6/CT (CLIP) ×2 IMPLANT
CLIP VESOCCLUDE SM WIDE 6/CT (CLIP) ×2 IMPLANT
CONT SPEC 4OZ CLIKSEAL STRL BL (MISCELLANEOUS) ×14 IMPLANT
COVER PROBE W GEL 5X96 (DRAPES) ×2 IMPLANT
COVER SURGICAL LIGHT HANDLE (MISCELLANEOUS) ×2 IMPLANT
DERMABOND ADVANCED (GAUZE/BANDAGES/DRESSINGS) ×1
DERMABOND ADVANCED .7 DNX12 (GAUZE/BANDAGES/DRESSINGS) ×1 IMPLANT
DEVICE DUBIN SPECIMEN MAMMOGRA (MISCELLANEOUS) ×2 IMPLANT
DRAPE CHEST BREAST 15X10 FENES (DRAPES) ×2 IMPLANT
DRAPE UNIVERSAL PACK (DRAPES) ×2 IMPLANT
DRAPE UTILITY XL STRL (DRAPES) ×2 IMPLANT
ELECT COATED BLADE 2.86 ST (ELECTRODE) ×2 IMPLANT
ELECT REM PT RETURN 9FT ADLT (ELECTROSURGICAL) ×2
ELECTRODE REM PT RTRN 9FT ADLT (ELECTROSURGICAL) ×1 IMPLANT
GAUZE SPONGE 4X4 12PLY STRL (GAUZE/BANDAGES/DRESSINGS) ×2 IMPLANT
GLOVE BIO SURGEON STRL SZ 6 (GLOVE) ×2 IMPLANT
GLOVE BIO SURGEON STRL SZ7 (GLOVE) ×4 IMPLANT
GLOVE BIOGEL PI IND STRL 7.0 (GLOVE) ×2 IMPLANT
GLOVE BIOGEL PI IND STRL 7.5 (GLOVE) ×1 IMPLANT
GLOVE BIOGEL PI INDICATOR 7.0 (GLOVE) ×2
GLOVE BIOGEL PI INDICATOR 7.5 (GLOVE) ×1
GLOVE INDICATOR 6.5 STRL GRN (GLOVE) ×2 IMPLANT
GOWN STRL REUS W/ TWL LRG LVL3 (GOWN DISPOSABLE) ×1 IMPLANT
GOWN STRL REUS W/TWL 2XL LVL3 (GOWN DISPOSABLE) ×2 IMPLANT
GOWN STRL REUS W/TWL LRG LVL3 (GOWN DISPOSABLE) ×1
KIT BASIN OR (CUSTOM PROCEDURE TRAY) ×2 IMPLANT
KIT MARKER MARGIN INK (KITS) ×2 IMPLANT
LIGHT WAVEGUIDE WIDE FLAT (MISCELLANEOUS) ×2 IMPLANT
NDL SAFETY ECLIPSE 18X1.5 (NEEDLE) IMPLANT
NEEDLE FILTER BLUNT 18X 1/2SAF (NEEDLE)
NEEDLE FILTER BLUNT 18X1 1/2 (NEEDLE) IMPLANT
NEEDLE HYPO 18GX1.5 SHARP (NEEDLE)
NEEDLE HYPO 25GX1X1/2 BEV (NEEDLE) ×2 IMPLANT
NS IRRIG 1000ML POUR BTL (IV SOLUTION) ×2 IMPLANT
PACK SURGICAL SETUP 50X90 (CUSTOM PROCEDURE TRAY) ×2 IMPLANT
PAD ABD 8X10 STRL (GAUZE/BANDAGES/DRESSINGS) ×2 IMPLANT
PENCIL BUTTON HOLSTER BLD 10FT (ELECTRODE) ×2 IMPLANT
SPONGE LAP 18X18 X RAY DECT (DISPOSABLE) ×2 IMPLANT
STOCKINETTE IMPERVIOUS 9X36 MD (GAUZE/BANDAGES/DRESSINGS) ×2 IMPLANT
STRIP CLOSURE SKIN 1/2X4 (GAUZE/BANDAGES/DRESSINGS) ×2 IMPLANT
SUT MNCRL AB 4-0 PS2 18 (SUTURE) ×2 IMPLANT
SUT VIC AB 3-0 SH 8-18 (SUTURE) ×2 IMPLANT
SYR BULB 3OZ (MISCELLANEOUS) ×2 IMPLANT
SYR CONTROL 10ML LL (SYRINGE) ×2 IMPLANT
TUBE CONNECTING 12X1/4 (SUCTIONS) ×2 IMPLANT
YANKAUER SUCT BULB TIP NO VENT (SUCTIONS) ×2 IMPLANT

## 2017-12-06 NOTE — Anesthesia Procedure Notes (Signed)
Anesthesia Regional Block: Pectoralis block   Pre-Anesthetic Checklist: ,, timeout performed, Correct Patient, Correct Site, Correct Laterality, Correct Procedure, Correct Position, site marked, Risks and benefits discussed,  Surgical consent,  Pre-op evaluation,  At surgeon's request and post-op pain management  Laterality: Right and Upper  Prep: chloraprep       Needles:  Injection technique: Single-shot  Needle Type: Echogenic Stimulator Needle     Needle Length: 9cm  Needle Gauge: 21   Needle insertion depth: 2.5 cm   Additional Needles:   Procedures:,,,, ultrasound used (permanent image in chart),,,,  Narrative:  Start time: 12/06/2017 8:17 AM End time: 12/06/2017 8:27 AM Injection made incrementally with aspirations every 5 mL.  Performed by: Personally  Anesthesiologist: Lyn Hollingshead, MD

## 2017-12-06 NOTE — Transfer of Care (Signed)
Immediate Anesthesia Transfer of Care Note  Patient: Rebekah Wells  Procedure(s) Performed: RIGHT BREAST LUMPECTOMY WITH RADIOACTIVE SEED AND SENTINEL LYMPH NODE BIOPSY (Right Breast)  Patient Location: PACU  Anesthesia Type:General  Level of Consciousness: awake, drowsy and patient cooperative  Airway & Oxygen Therapy: Patient Spontanous Breathing and Patient connected to nasal cannula oxygen  Post-op Assessment: Report given to RN and Post -op Vital signs reviewed and stable  Post vital signs: Reviewed and stable  Last Vitals:  Vitals Value Taken Time  BP 121/73 12/06/2017 12:03 PM  Temp    Pulse 81 12/06/2017 12:04 PM  Resp 11 12/06/2017 12:04 PM  SpO2 98 % 12/06/2017 12:04 PM  Vitals shown include unvalidated device data.  Last Pain:  Vitals:   12/06/17 0733  TempSrc:   PainSc: 0-No pain         Complications: No apparent anesthesia complications

## 2017-12-06 NOTE — Anesthesia Procedure Notes (Signed)
Procedure Name: LMA Insertion Date/Time: 12/06/2017 9:57 AM Performed by: Renato Shin, CRNA Pre-anesthesia Checklist: Patient identified, Emergency Drugs available, Suction available and Patient being monitored Patient Re-evaluated:Patient Re-evaluated prior to induction Oxygen Delivery Method: Circle system utilized Preoxygenation: Pre-oxygenation with 100% oxygen Induction Type: IV induction LMA: LMA inserted LMA Size: 3.0 Number of attempts: 1 Placement Confirmation: positive ETCO2,  CO2 detector and breath sounds checked- equal and bilateral Tube secured with: Tape Dental Injury: Teeth and Oropharynx as per pre-operative assessment

## 2017-12-06 NOTE — Discharge Instructions (Addendum)
[facility] °Office Phone Number [phone] ° °BREAST BIOPSY/ PARTIAL MASTECTOMY: POST OP INSTRUCTIONS ° °Always review your discharge instruction sheet given to you by the facility where your surgery was performed. ° °IF YOU HAVE DISABILITY OR FAMILY LEAVE FORMS, YOU MUST BRING THEM TO THE OFFICE FOR PROCESSING.  DO NOT GIVE THEM TO YOUR DOCTOR. ° °1. A prescription for pain medication may be given to you upon discharge.  Take your pain medication as prescribed, if needed.  If narcotic pain medicine is not needed, then you may take acetaminophen (Tylenol) or ibuprofen (Advil) as needed. °2. Take your usually prescribed medications unless otherwise directed °3. If you need a refill on your pain medication, please contact your pharmacy.  They will contact our office to request authorization.  Prescriptions will not be filled after 5pm or on week-ends. °4. You should eat very light the first 24 hours after surgery, such as soup, crackers, pudding, etc.  Resume your normal diet the day after surgery. °5. Most patients will experience some swelling and bruising in the breast.  Ice packs and a good support bra will help.  Swelling and bruising can take several days to resolve.  °6. It is common to experience some constipation if taking pain medication after surgery.  Increasing fluid intake and taking a stool softener will usually help or prevent this problem from occurring.  A mild laxative (Milk of Magnesia or Miralax) should be taken according to package directions if there are no bowel movements after 48 hours. °7. Unless discharge instructions indicate otherwise, you may remove your bandages 48 hours after surgery, and you may shower at that time.  You may have steri-strips (small skin tapes) in place directly over the incision.  These strips should be left on the skin for 7-10 days.   Any sutures or staples will be removed at the office during your follow-up visit. °8. ACTIVITIES:  You may resume  regular daily activities (gradually increasing) beginning the next day.  Wearing a good support bra or sports bra (or the breast binder) minimizes pain and swelling.  You may have sexual intercourse when it is comfortable. °a. You may drive when you no longer are taking prescription pain medication, you can comfortably wear a seatbelt, and you can safely maneuver your car and apply brakes. °b. RETURN TO WORK:  __________1 week_______________ °9. You should see your doctor in the office for a follow-up appointment approximately two weeks after your surgery.  Your doctor’s nurse will typically make your follow-up appointment when she calls you with your pathology report.  Expect your pathology report 2-3 business days after your surgery.  You may call to check if you do not hear from us after three days. ° ° °WHEN TO CALL YOUR DOCTOR: °1. Fever over 101.0 °2. Nausea and/or vomiting. °3. Extreme swelling or bruising. °4. Continued bleeding from incision. °5. Increased pain, redness, or drainage from the incision. ° °The clinic staff is available to answer your questions during regular business hours.  Please don’t hesitate to call and ask to speak to one of the nurses for clinical concerns.  If you have a medical emergency, go to the nearest emergency room or call 911.  A surgeon from [facility] is always on call at the hospital. ° °For further questions, please visit [email or web address]  ° °

## 2017-12-06 NOTE — Anesthesia Postprocedure Evaluation (Signed)
Anesthesia Post Note  Patient: Rebekah Wells  Procedure(s) Performed: RIGHT BREAST LUMPECTOMY WITH RADIOACTIVE SEED AND SENTINEL LYMPH NODE BIOPSY (Right Breast)     Patient location during evaluation: PACU Anesthesia Type: General Level of consciousness: awake Pain management: pain level controlled Vital Signs Assessment: post-procedure vital signs reviewed and stable Respiratory status: spontaneous breathing Cardiovascular status: stable Postop Assessment: no apparent nausea or vomiting Anesthetic complications: no    Last Vitals:  Vitals:   12/06/17 1248 12/06/17 1300  BP: 115/66   Pulse: 80   Resp: 12   Temp:  36.6 C  SpO2: 91%     Last Pain:  Vitals:   12/06/17 1230  TempSrc:   PainSc: 0-No pain   Pain Goal:                 Rebekah Wells,Rebekah Wells

## 2017-12-06 NOTE — Interval H&P Note (Signed)
History and Physical Interval Note:  12/06/2017 9:27 AM  Rebekah Wells  has presented today for surgery, with the diagnosis of RIGHT BREAST CANCER  The various methods of treatment have been discussed with the patient and family. After consideration of risks, benefits and other options for treatment, the patient has consented to  Procedure(s): RIGHT BREAST LUMPECTOMY WITH RADIOACTIVE SEED AND SENTINEL LYMPH NODE BIOPSY (Right) as a surgical intervention .  The patient's history has been reviewed, patient examined, no change in status, stable for surgery.  I have reviewed the patient's chart and labs.  Questions were answered to the patient's satisfaction.     Stark Klein

## 2017-12-06 NOTE — Op Note (Signed)
Right Breast Radioactive seed localized lumpectomy and sentinel lymph node biopsy  Indications: This patient presents with history of right breast cancer, lower inner quadrant, grade 2 invasive ductal carcinoma, +/+/-  Pre-operative Diagnosis: right breast cancer as above  Post-operative Diagnosis: right breast cancer  Surgeon: Stark Klein   Anesthesia: General endotracheal anesthesia  ASA Class: 2  Procedure Details  The patient was seen in the Holding Room. The risks, benefits, complications, treatment options, and expected outcomes were discussed with the patient. The possibilities of bleeding, infection, the need for additional procedures, failure to diagnose a condition, and creating a complication requiring transfusion or operation were discussed with the patient. The patient concurred with the proposed plan, giving informed consent.  The site of surgery properly noted/marked. The patient was taken to Operating Room # 2, identified, and the procedure verified as right  Breast seed localized Lumpectomy with sentinel lymph node biopsy. A Time Out was held and the above information confirmed.  The right arm, breast, and chest were prepped and draped in standard fashion. The lumpectomy was performed by creating a inferior circumaeolar incision near the previously placed radioactive seed.  Dissection was carried down to around the point of maximum signal intensity. The cautery was used to perform the dissection.  Hemostasis was achieved with cautery. The edges of the cavity were marked with large clips, with one each medial, lateral, inferior and superior, and two clips posteriorly.   The specimen was inked with the margin marker paint kit.    Specimen radiography confirmed inclusion of the mammographic lesion, the clip, and the seed.  The background signal in the breast was zero.  The seed was close posteriorly and inferiorly, so additional posterior and inferior margin was taken and marked.  The  wound was irrigated and closed with 3-0 vicryl in layers and 4-0 monocryl subcuticular suture.    Using a hand-held gamma probe, right axillary sentinel nodes were identified transcutaneously.  An oblique incision was created below the axillary hairline.  Dissection was carried through the clavipectoral fascia.  Five level 2 axillary sentinel nodes were removed.  Counts per second were at the highest level 1257.    The background count was 110 cps.  The wound was irrigated.  Hemostasis was achieved with cautery.  The axillary incision was closed with a 3-0 vicryl deep dermal interrupted sutures and a 4-0 monocryl subcuticular closure.    Sterile dressings were applied. At the end of the operation, all sponge, instrument, and needle counts were correct.  Findings: grossly clear surgical margins and no adenopathy.  Posterior margin is pectoralis, anterior and inferior margin is skin.    Estimated Blood Loss:  min         Specimens: right breast lumpectomy, posterior margin, inferior margin and five axillary sentinel lymph nodes.             Complications:  None; patient tolerated the procedure well.         Disposition: PACU - hemodynamically stable.         Condition: stable

## 2017-12-07 ENCOUNTER — Encounter (HOSPITAL_COMMUNITY): Payer: Self-pay | Admitting: General Surgery

## 2017-12-08 NOTE — Progress Notes (Signed)
Please let patient know margins and LN are negative for cancer!

## 2017-12-10 ENCOUNTER — Telehealth: Payer: Self-pay | Admitting: *Deleted

## 2017-12-10 NOTE — Telephone Encounter (Signed)
Received order for oncotype testing. Requisition sent to pathology. Received by Keisha 

## 2017-12-20 ENCOUNTER — Telehealth: Payer: Self-pay | Admitting: *Deleted

## 2017-12-20 NOTE — Telephone Encounter (Signed)
Called pt to inform oncotype score will not be back for 11/1 f/u appt with Dr. Jana Hakim and offered to move appt. Pt wishes to keep current scheduled appt with Dr. Jana Hakim.  Informed Dr. Jana Hakim of pt wishes.

## 2017-12-20 NOTE — Progress Notes (Signed)
[facility]  Telephone:([phone] Fax:([phone]) [phone]     ID: Rebekah Wells DOB: 1940-09-09  MR#: [id number]  HUD#:[id number]  Patient Care Team: Lennox Grumbles, MD as PCP - General (Family Medicine) Stark Klein, MD as Consulting Physician (General Surgery) Omari Mcmanaway, Virgie Dad, MD as Consulting Physician (Oncology) Kyung Rudd, MD as Consulting Physician (Radiation Oncology) Paralee Cancel, MD as Consulting Physician (Orthopedic Surgery) OTHER MD:   CHIEF COMPLAINT: Estrogen receptor positive breast cancer  CURRENT TREATMENT: Tamoxifen   HISTORY OF CURRENT ILLNESS: From the original intake note:  Rebekah Wells had routine screening mammography on 10/03/2017 showing a possible abnormality in the right breast. She underwent unilateral right diagnostic mammography with tomography and right breast ultrasonography at the [facility] in [place], [place] on 10/17/2017 showing: breast density category C. In the right breast lower inner quadrant at 5:30 o' clock and 2 cm from the nipple, there is a hypoechoic mass that measures 0.8 x 0.9 x 0.9 cm. Additionally, at the upper outer quadrant at 10 o'clock and 7 cm from the nipple there is an ill defined area with focal mixed echogenicity. Sonographically, there was one lymph node in the right axilla with a slightly thickened cortex.   Accordingly on 11/20/2017 she proceeded to biopsy of the right breast area in question. The pathology from this procedure showed ([id number]): At 10 o' clock, benign breast tissue with no malignancy identified.  Because there was significant fibrosis in the area, this may be discordant.  At the 5:30 lower inner quadrant, invasive ductal carcinoma grade II, with calcifications. Prognostic indicators significant for: estrogen receptor, 100% positive with strong staining intensity and progesterone receptor, 40% positive with moderate staining intensity. HER 2 is negative by FISH, with a signals ratio  of 1.29, and the average number per cell 1.9.  The patient's subsequent history is as detailed below.  INTERVAL HISTORY: Rebekah Wells returns today for follow-up. She is here with her husband.  Since our last visit, she had a breast MRI on 11/28/2017 that revealed a single right breast 5:30 o'clock malignancy measuring 1 cm with no evidence of lymphadenopathy. There was no MRI evidence of left breast malignancy.  She also had a DEXA scan on 12/05/2017 that revealed T score of -2.5.  She underwent right breast lumpectomy on 12/06/2017 with Dr. Barry Dienes with pathology (Accession: [id number]) showing invasive ductal carcinoma, 1.4 cm, nottingham grade II of III. Margins of resection are not involved (closest margin: 1 mm, posterior). 5 lymph nodes examined, no metastasis found, though 2 had isolated tumor cells.. Estrogen Receptor: 100%, positive, strong (as reported) Progesterone Receptor: 40%, positive, moderate (as reported) HER2: Negative by FISH (as reported)   She had some dull aches after surgery and states that seed placement was painful. She used hydrocodone at night and tylenol during the day as needed for the pain. She used these medications for a few days after surgery, but uses them much less now. She denies constipation. She was very happy with her Anesthesiologist that provided her reassurance regarding the shot.   REVIEW OF SYSTEMS:   She had bilateral knee replacement about a year ago.  At least she denies unusual headaches, visual changes, nausea, vomiting, stiff neck, dizziness, or gait imbalance. There has been no cough, phlegm production, or pleurisy, no chest pain or pressure, and no change in bowel or bladder habits. The patient denies fever, rash, bleeding, unexplained fatigue or unexplained weight loss. A detailed review of systems was otherwise entirely negative.  PAST  MEDICAL HISTORY: Past Medical History:  Diagnosis Date  . Arthritis    OA  . Asthma   . Cancer ([place])    right  breast  . Dysrhythmia    tachycardia  . Gastritis   . GERD (gastroesophageal reflux disease)   . H/O seasonal allergies   . Hyperlipemia     PAST SURGICAL HISTORY: Past Surgical History:  Procedure Laterality Date  . ABDOMINAL HYSTERECTOMY    . BREAST LUMPECTOMY WITH RADIOACTIVE SEED AND SENTINEL LYMPH NODE BIOPSY Right 12/06/2017   Procedure: RIGHT BREAST LUMPECTOMY WITH RADIOACTIVE SEED AND SENTINEL LYMPH NODE BIOPSY;  Surgeon: Stark Klein, MD;  Location: [facility];  Service: General;  Laterality: Right;  . CARPAL TUNNEL RELEASE Left 07/09/2014   Procedure: LEFT CARPAL TUNNEL RELEASE;  Surgeon: Daryll Brod, MD;  Location: [facility];  Service: Orthopedics;  Laterality: Left;  . CESAREAN SECTION    . CHOLECYSTECTOMY    . DG TOES RT FOOT MULTIPLE SPECIF ([place] HX) Right   . DIAGNOSTIC LAPAROSCOPY    . DIGIT NAIL REMOVAL Right 12/05/2016   Procedure: REMOVAL OF SCREW RIGHT GREAT TOE;  Surgeon: Paralee Cancel, MD;  Location: [facility];  Service: Orthopedics;  Laterality: Right;  . EYE SURGERY     bilateral cataract with lens implants  . HAND ARTHROPLASTY Right    for OA  . KNEE ARTHROSCOPY     x3  . NISSEN FUNDOPLICATION    . TONSILLECTOMY    . TOTAL KNEE ARTHROPLASTY Right 12/05/2016   Procedure: RIGHT TOTAL KNEE ARTHROPLASTY;  Surgeon: Paralee Cancel, MD;  Location: [facility];  Service: Orthopedics;  Laterality: Right;  90 mins  . TOTAL KNEE ARTHROPLASTY Left 03/20/2017   Procedure: LEFT TOTAL KNEE ARTHROPLASTY;  Surgeon: Paralee Cancel, MD;  Location[facility];  Service: Orthopedics;  Laterality: Left;  70 mins  Appendectomy   FAMILY HISTORY Family History  Problem Relation Age of Onset  . Colon cancer Mother   . CAD Father 23  . Lung cancer Sister   The patient's father died at age 70 of CAD. The patient's mother died at age 51 due to colon cancer. The patient had 2 brothers and 2 sisters.  1 of the patient's sisters died of lung cancer ( heavy smoker). The patient's other  sister is alive with CLL. There was a maternal grandfather with colon cancer. There was a maternal grandmother with sarcoma of the leg. The patient denies a family history of breast or ovarian cancer.   GYNECOLOGIC HISTORY:  No LMP recorded. Patient has had a hysterectomy. Menarche: 77 years old Age at first live birth: 77 years old She is GXP4.  She is status post patial hysterectomy without BSO. Her LMP was around age 62. She used HRT for 10 years. She used oral contraception for 1 year in 1969.    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 Her husband, [name] was a Personal assistant. The patient's daughter [name] is a respiratory therapist in [place], [place]. The patient's son, Virgel Bouquet lives in [place], [place] and works in Risk analyst and event planning. The patient's daughter, Junita Push lives in [place], [place] and is a Theatre manager. The patient's daughter Dr. Sheryn [place] is a rheumatologist in [place].      ADVANCED DIRECTIVES:    HEALTH MAINTENANCE: Social History   Tobacco Use  . Smoking status: Never Smoker  . Smokeless tobacco: Never Used  Substance Use Topics  . Alcohol use: No  . Drug use: No  Colonoscopy: in [place] over 7 years ago  PAP: remote  Bone density: 12/05/2017 T score -2.5   Allergies  Allergen Reactions  . Penicillins Rash and Other (See Comments)    PATIENT HAS HAD A PCN REACTION WITH IMMEDIATE RASH, FACIAL/TONGUE/THROAT SWELLING, SOB, OR LIGHTHEADEDNESS WITH HYPOTENSION:  #  #  YES  #  #  HAS PT DEVELOPED SEVERE RASH INVOLVING MUCUS MEMBRANES or SKIN NECROSIS: #  #  YES  #  #  Has patient had a PCN reaction that required hospitalization: ALREADY HOSPITALIZED Has patient had a PCN reaction occurring within the last 10 years: No   . Other Rash    Steri Strips/Surgi glue (Dermatitis) Patient developed contact dermatitis with dermabond following knee surgery and, possibly, with steri strips following her breast core biopsy  . Silver Rash    Likely  due to Sulfa component  . Sulfa Antibiotics Rash    Current Outpatient Medications  Medication Sig Dispense Refill  . albuterol (PROVENTIL HFA;VENTOLIN HFA) 108 (90 BASE) MCG/ACT inhaler Inhale 1-2 puffs into the lungs every 6 (six) hours as needed for wheezing or shortness of breath.     . beta carotene w/minerals (OCUVITE) tablet Take 1 tablet by mouth 4 (four) times a week. At night.    . Calcium Carb-Cholecalciferol (CALCIUM 600+D3 PO) Take 1 tablet by mouth at bedtime.    . cetirizine (ZYRTEC) 10 MG tablet Take 10 mg by mouth at bedtime as needed for allergies.     . cholecalciferol (VITAMIN D) 1000 units tablet Take 1 tablet (1,000 Units total) by mouth daily. 90 tablet 4  . Fluticasone-Salmeterol (ADVAIR) 250-50 MCG/DOSE AEPB Inhale 1 puff into the lungs daily.    Marland Kitchen HYDROcodone-acetaminophen (NORCO/VICODIN) 5-325 MG tablet Take 1 tablet by mouth every 6 (six) hours as needed for moderate pain. 20 tablet 0  . meloxicam (MOBIC) 15 MG tablet Take 15 mg by mouth daily as needed for pain.    . metoprolol succinate (TOPROL-XL) 25 MG 24 hr tablet Take 25 mg by mouth at bedtime.     Marland Kitchen omeprazole (PRILOSEC) 20 MG capsule Take 20 mg by mouth daily as needed (for acid reflex).     . simvastatin (ZOCOR) 40 MG tablet Take 40 mg by mouth every evening.     . tamoxifen (NOLVADEX) 20 MG tablet Take 1 tablet (20 mg total) by mouth daily. 90 tablet 12  . traZODone (DESYREL) 50 MG tablet Take 25 mg by mouth at bedtime as needed for sleep.     No current facility-administered medications for this visit.     OBJECTIVE: Older white woman who appears well  Vitals:   12/21/17 0857  BP: 114/77  Pulse: 91  Resp: 18  Temp: 98 F (36.7 C)  SpO2: 99%     Body mass index is 26.78 kg/m.   Wt Readings from Last 3 Encounters:  12/21/17 151 lb 3.2 oz (68.6 kg)  12/06/17 148 lb 1.6 oz (67.2 kg)  12/05/17 148 lb 1.6 oz (67.2 kg)      ECOG FS:1 - Symptomatic but completely ambulatory  Sclerae unicteric,  EOMs intact Oropharynx clear and moist No cervical or supraclavicular adenopathy Lungs no rales or rhonchi Heart regular rate and rhythm Abd soft, nontender, positive bowel sounds MSK no focal spinal tenderness, no upper extremity lymphedema Neuro: nonfocal, well oriented, appropriate affect Breasts: The right breast is status post recent lumpectomy.  The incisions are healing nicely, without erythema, dehiscence, or swelling.  The left breast  is benign.  Both axillae are benign.  LAB RESULTS:  CMP     Component Value Date/Time   NA 139 12/05/2017 0944   K 3.7 12/05/2017 0944   CL 108 12/05/2017 0944   CO2 22 12/05/2017 0944   GLUCOSE 183 (H) 12/05/2017 0944   BUN 13 12/05/2017 0944   CREATININE 0.75 12/05/2017 0944   CREATININE 0.75 11/21/2017 1221   CALCIUM 9.0 12/05/2017 0944   PROT 6.9 11/21/2017 1221   ALBUMIN 4.0 11/21/2017 1221   AST 39 11/21/2017 1221   ALT 44 11/21/2017 1221   ALKPHOS 78 11/21/2017 1221   BILITOT 0.4 11/21/2017 1221   GFRNONAA >60 12/05/2017 0944   GFRNONAA >60 11/21/2017 1221   GFRAA >60 12/05/2017 0944   GFRAA >60 11/21/2017 1221    No results found for: TOTALPROTELP, ALBUMINELP, A1GS, A2GS, BETS, BETA2SER, GAMS, MSPIKE, SPEI  No results found for: KPAFRELGTCHN, LAMBDASER, KAPLAMBRATIO  Lab Results  Component Value Date   WBC 6.9 12/05/2017   NEUTROABS 3.7 11/21/2017   HGB 12.8 12/05/2017   HCT 43.2 12/05/2017   MCV 91.7 12/05/2017   PLT 259 12/05/2017    '@LASTCHEMISTRY'$ @  No results found for: LABCA2  No components found for: QIWLNL892  No results for input(s): INR in the last 168 hours.  No results found for: LABCA2  No results found for: JJH417  No results found for: EYC144  No results found for: YJE563  No results found for: CA2729  No components found for: HGQUANT  No results found for: CEA1 / No results found for: CEA1   No results found for: AFPTUMOR  No results found for: CHROMOGRNA  No results found for:  PSA1  No visits with results within 3 Day(s) from this visit.  Latest known visit with results is:  Hospital Outpatient Visit on 12/05/2017  Component Date Value Ref Range Status  . WBC 12/05/2017 6.9  4.0 - 10.5 K/uL Final  . RBC 12/05/2017 4.71  3.87 - 5.11 MIL/uL Final  . Hemoglobin 12/05/2017 12.8  12.0 - 15.0 g/dL Final  . HCT 12/05/2017 43.2  36.0 - 46.0 % Final  . MCV 12/05/2017 91.7  80.0 - 100.0 fL Final  . MCH 12/05/2017 27.2  26.0 - 34.0 pg Final  . MCHC 12/05/2017 29.6* 30.0 - 36.0 g/dL Final  . RDW 12/05/2017 12.4  11.5 - 15.5 % Final  . Platelets 12/05/2017 259  150 - 400 K/uL Final  . nRBC 12/05/2017 0.0  0.0 - 0.2 % Final   Performed at [facility], [place] [address], [place], [place] [address]  . Sodium 12/05/2017 139  135 - 145 mmol/L Final  . Potassium 12/05/2017 3.7  3.5 - 5.1 mmol/L Final  . Chloride 12/05/2017 108  98 - 111 mmol/L Final  . CO2 12/05/2017 22  22 - 32 mmol/L Final  . Glucose, Bld 12/05/2017 183* 70 - 99 mg/dL Final  . BUN 12/05/2017 13  8 - 23 mg/dL Final  . Creatinine, Ser 12/05/2017 0.75  0.44 - 1.00 mg/dL Final  . Calcium 12/05/2017 9.0  8.9 - 10.3 mg/dL Final  . GFR calc non Af Amer 12/05/2017 >60  >60 mL/min Final  . GFR calc Af Amer 12/05/2017 >60  >60 mL/min Final   Comment: (NOTE) The eGFR has been calculated using the CKD EPI equation. This calculation has not been validated in all clinical situations. eGFR's persistently <60 mL/min signify possible Chronic Kidney Disease.   . Anion gap 12/05/2017 9  5 - 15 Final   Performed at [facility], [place] [address], [place], [place] [address]    (this displays the last labs from the last 3 days)  No results found for: TOTALPROTELP, ALBUMINELP, A1GS, A2GS, BETS, BETA2SER, GAMS, MSPIKE, SPEI (this displays SPEP labs)  No results found for: KPAFRELGTCHN, LAMBDASER, KAPLAMBRATIO (kappa/lambda light chains)  No results found for: HGBA, HGBA2QUANT, HGBFQUANT,  HGBSQUAN (Hemoglobinopathy evaluation)   No results found for: LDH  No results found for: IRON, TIBC, IRONPCTSAT (Iron and TIBC)  No results found for: FERRITIN  Urinalysis No results found for: COLORURINE, APPEARANCEUR, LABSPEC, PHURINE, GLUCOSEU, HGBUR, BILIRUBINUR, KETONESUR, PROTEINUR, UROBILINOGEN, NITRITE, LEUKOCYTESUR   STUDIES: Mr Breast Bilateral W [place] Cad  Result Date: 11/28/2017 CLINICAL DATA:  New diagnosis of right breast cancer. The patient underwent ultrasound-guided core needle biopsy of right breast 5:30 o'clock 9 mm mass demonstrating IDC, grade 2, and ultrasound-guided core needle biopsy of right breast 10 o'clock hypoechoic area demonstrating stromal fibrosis. LABS:  Creatinine 0.7, GFR 60 EXAM: BILATERAL BREAST MRI WITH AND WITHOUT CONTRAST TECHNIQUE: Multiplanar, multisequence MR images of both breasts were obtained prior to and following the intravenous administration of 7 ml of Gadavist Three-dimensional MR images were rendered by post-processing the original MR data using the DynaCAD thin client. The 3D MR images are interpreted and the findings are included in the complete MRI report below. COMPARISON:  Previous exam(s). FINDINGS: Breast composition: c. Heterogeneous fibroglandular tissue. Background parenchymal enhancement: Moderate. Right breast: The biopsy-proven right breast 5:30 o'clock malignancy presents as a thick progressively rim enhancing mass which measures 1 x 1 x 1 cm. No measurable enhancement is seen at the site of the second core needle biopsy in the right breast upper outer quadrant. Left breast: No mass or abnormal enhancement. Lymph nodes: No abnormal appearing lymph nodes. Ancillary findings:  None. IMPRESSION: 1. Single right breast 5:30 o'clock malignancy measuring 1 cm. 2. No evidence of lymphadenopathy. 3. No MRI evidence of left breast malignancy. RECOMMENDATION: Continue with plan of care for known right breast cancer. BI-RADS CATEGORY   6: Known biopsy-proven malignancy. Electronically Signed   By: Fidela Salisbury M.D.   On: 11/28/2017 14:42   Nm Sentinel Node Inj-no Rpt (breast)  Result Date: 12/06/2017 Sulfur colloid was injected by the nuclear medicine technologist for melanoma sentinel node.   Mm Breast Surgical Specimen  Result Date: 12/06/2017 CLINICAL DATA:  Radioactive seed localization was performed December 05, 2017 prior to lumpectomy for invasive ductal carcinoma in the lower inner right breast. EXAM: SPECIMEN RADIOGRAPH OF THE RIGHT BREAST COMPARISON:  Previous exam(s). FINDINGS: Status post excision of the right breast. The radioactive seed and biopsy marker clip are present, completely intact, and were marked for pathology. IMPRESSION: Specimen radiograph of the right breast. Electronically Signed   By: Curlene Dolphin M.D.   On: 12/06/2017 10:46   Dg Bone Density  Result Date: 12/05/2017 EXAM: DUAL X-RAY ABSORPTIOMETRY (DXA) FOR BONE MINERAL DENSITY IMPRESSION: Referring Physician:  Chauncey Cruel Your patient completed a BMD test using Lunar IDXA DXA system ( analysis version: 16 ) manufactured by [employer]. Technologist: AW PATIENT: Name: Arnelle, Nale Patient ID: [id number] Birth Date: 1941-01-10 Height: 63.0 in. Sex: Female Measured: 12/05/2017 Weight: 150.2 lbs. Indications: Advanced Age, Bilateral Ovariectomy (65.51), Caucasian, Desyrel, Estrogen Deficient, Hysterectomy, Omeprazole, Postmenopausal Fractures: Foot Treatments: Calcium (E943.0), Vitamin D (E933.5) ASSESSMENT: The BMD measured at Femur Neck Left is 0.693 g/cm2 with a T-score of -2.5. This patient  is considered OSTEOPOROTIC according to [facility]) criteria. The scan quality is good. Site Region Measured Date Measured Age YA T-score BMD Significant CHANGE DualFemur Neck Left  11/05/2017    77.8         -2.5    0.693 g/cm2 AP Spine  L1-L4      11/05/2017    77.8         -1.0    1.059 g/cm2 DualFemur Total Mean 12/05/2017     77.8         -1.3    0.839 g/cm2 World Health Organization [facility]) criteria for post-menopausal, Caucasian Women: Normal       T-score at or above -1 SD Osteopenia   T-score between -1 and -2.5 SD Osteoporosis T-score at or below -2.5 SD RECOMMENDATION: 1. All patients should optimize calcium and vitamin D intake. 2. Consider FDA approved medical therapies in postmenopausal women and men aged 42 years and older, based on the following: a. A hip or vertebral (clinical or morphometric) fracture b. T- score < or = -2.5 at the femoral neck or spine after appropriate evaluation to exclude secondary causes c. Low bone mass (T-score between -1.0 and -2.5 at the femoral neck or spine) and a 10 year probability of a hip fracture > or = 3% or a 10 year probability of a major osteoporosis-related fracture > or = 20% based on the [place]-adapted WHO algorithm d. Clinician judgment and/or patient preferences may indicate treatment for people with 10-year fracture probabilities above or below these levels FOLLOW-UP: People with diagnosed cases of osteoporosis or at high risk for fracture should have regular bone mineral density tests. For patients eligible for Medicare, routine testing is allowed once every 2 years. The testing frequency can be increased to one year for patients who have rapidly progressing disease, those who are receiving or discontinuing medical therapy to restore bone mass, or have additional risk factors. I have reviewed this report and agree with the above findings. Mark A. Thornton Papas, M.D. [facility] Radiology Electronically Signed   By: Lavonia Dana M.D.   On: 12/05/2017 11:21   Mm Rt Radioactive Seed Loc Mammo Guide  Result Date: 12/05/2017 CLINICAL DATA:  Preoperative radioactive seed localization of post biopsy marker in the right 5:30 o'clock breast, prior to right breast lumpectomy for invasive ductal carcinoma. EXAM: MAMMOGRAPHIC GUIDED RADIOACTIVE SEED LOCALIZATION OF THE RIGHT BREAST COMPARISON:  Previous  exam(s). FINDINGS: Patient presents for radioactive seed localization prior to right breast lumpectomy. I met with the patient and we discussed the procedure of seed localization including benefits and alternatives. We discussed the high likelihood of a successful procedure. We discussed the risks of the procedure including infection, bleeding, tissue injury and further surgery. We discussed the low dose of radioactivity involved in the procedure. Informed, written consent was given. The usual time-out protocol was performed immediately prior to the procedure. Using mammographic guidance, sterile technique, 1% lidocaine and an I-125 radioactive seed, a HydroMARK in the lower inner right breast was localized using a medial approach. The follow-up mammogram images confirm the seed in the expected location and were marked for Dr. Barry Dienes. Follow-up survey of the patient confirms presence of the radioactive seed. Order number of I-125 seed:  [id number]. Total activity:  7.035 millicurie reference Date: November 07, 2017 The patient tolerated the procedure well and was released from the [place]. She was given instructions regarding seed removal. IMPRESSION: Radioactive seed localization right breast. No apparent complications. Electronically Signed  By: Fidela Salisbury M.D.   On: 12/05/2017 14:42    ELIGIBLE FOR AVAILABLE RESEARCH PROTOCOL: no  ASSESSMENT: 77 y.o. [place], [place] woman status post right breast biopsy x2 on 10/24/2017 for a clinical T1b NX, stage IA invasive ductal carcinoma, grade 2, estrogen and progesterone receptor positive, HER-2 not amplified  (a) biopsy of a second site in the same breast showed fibrosis, discordant  (1) status post right lumpectomy and sentinel lymph node sampling 12/06/2017 for a pT1c pN0(i), stage IA invasive ductal carcinoma, with negative margins.  (a) a total of 5 axillary lymph nodes were removed  (2) Oncotype score of 19 predicts a risk of recurrence  outside the breast in the next 9 years of 6% if her only systemic therapy is an antiestrogen for 5 years; it also predicts no significant benefit from chemotherapy.  (4) adjuvant radiation to be considered  (5) tamoxifen started 12/21/2017  (a) status post prior hysterectomy without salpingo-oophorectomy  PLAN: Rebekah Wells did well with her surgery and is now ready to consider additional treatments.  We reviewed her Oncotype score which is very favorable.  She understands that by taking an antiestrogen for 5 years she has a 94% chance of not developing metastases from this cancer.  Chemotherapy would not improve those odds  The role of radiation adjuvantly is for local control and it does a very good job of that.  I think it would be a good idea for her to discuss it with Dr. Lisbeth Renshaw and she is agreeable.  On the other hand she lives approximately 3 hours away from here and she would have to move in with her daughter for some time if she were to receive radiation here.  As far as antiestrogens is concerned we discussed her bone density report which shows osteoporosis.  We frequently use denosumab/Prolia in these cases.  She has been on Fosamax before.  She is going to be meeting with her primary care physician to discuss options later this month.  Given the findings from the bone density I think tamoxifen may be a better choice for her.  We discussed the possible toxicities, side effects and complications of this agent including the rare cases of blood clots.  He will start it now and see me again in January.  If she tolerates it well the plan will be for her to receive tamoxifen for 5 years  She knows to call for any other issues that may develop before the next visit.    Kendel Bessey, Virgie Dad, MD  12/21/17 5:47 PM Medical Oncology and Hematology [facility] [address] [place], [place] [address] Tel. [phone]    Fax. [phone]  Lendon Ka, am acting as scribe for  Chauncey Cruel MD.  I, Lurline Del MD, have reviewed the above documentation for accuracy and completeness, and I agree with the above.

## 2017-12-21 ENCOUNTER — Encounter (HOSPITAL_COMMUNITY): Payer: Self-pay | Admitting: Oncology

## 2017-12-21 ENCOUNTER — Telehealth: Payer: Self-pay | Admitting: Oncology

## 2017-12-21 ENCOUNTER — Inpatient Hospital Stay: Payer: Medicare Other | Attending: Oncology | Admitting: Oncology

## 2017-12-21 ENCOUNTER — Telehealth: Payer: Self-pay | Admitting: *Deleted

## 2017-12-21 VITALS — BP 114/77 | HR 91 | Temp 98.0°F | Resp 18 | Ht 63.0 in | Wt 151.2 lb

## 2017-12-21 DIAGNOSIS — C50311 Malignant neoplasm of lower-inner quadrant of right female breast: Secondary | ICD-10-CM | POA: Insufficient documentation

## 2017-12-21 DIAGNOSIS — Z923 Personal history of irradiation: Secondary | ICD-10-CM | POA: Diagnosis not present

## 2017-12-21 DIAGNOSIS — Z90710 Acquired absence of both cervix and uterus: Secondary | ICD-10-CM

## 2017-12-21 DIAGNOSIS — Z170 Estrogen receptor positive status [ER+]: Secondary | ICD-10-CM | POA: Diagnosis not present

## 2017-12-21 DIAGNOSIS — Z79810 Long term (current) use of selective estrogen receptor modulators (SERMs): Secondary | ICD-10-CM | POA: Diagnosis not present

## 2017-12-21 DIAGNOSIS — Z79899 Other long term (current) drug therapy: Secondary | ICD-10-CM | POA: Insufficient documentation

## 2017-12-21 MED ORDER — VITAMIN D 1000 UNITS PO TABS: 1000.0000 [IU] | ORAL_TABLET | Freq: Every day | ORAL | 4 refills | Status: DC

## 2017-12-21 MED ORDER — TAMOXIFEN CITRATE 20 MG PO TABS: 20.0000 mg | ORAL_TABLET | Freq: Every day | ORAL | 12 refills | Status: AC

## 2017-12-21 NOTE — Telephone Encounter (Signed)
Gave pt avs and calendar  °

## 2017-12-21 NOTE — Telephone Encounter (Signed)
Received oncotype score of 12/6%. Physician team notified.

## 2017-12-26 NOTE — Progress Notes (Signed)
Location of Breast Cancer:Malignant neoplasm of lower-inner quadrant of right breast of female, estrogen receptor positive   Histology per Pathology Report:  US BREAST BX RT (10/24/2017 3:22 PM EDT) US BREAST BX RT (10/24/2017 3:22 PM EDT)  Specimen     US BREAST BX RT (10/24/2017 3:22 PM EDT)  Addenda  Addendum by Beverlee Nims, MD on 10/29/2017 8:57 AM  ADDENDUM:    The biopsies were performed by Dr. Durene Romans.    Pathology: 10/26/2017. Pathology is grade 1 invasive carcinoma at the 5:30   location and concordant with imaging. The other biopsy in the upper outer   right breast was benign fibrous tissue. These findings were discussed with   the patient by telephone.    Recommendations: Surgical consultation    Pre-surgical planning: The focal malignancy in the lower right breast is   amenable to localization and surgical excision.    Distance from mass to skin: 13 mm  Distance from mass to chest wall: 6 mm      FINAL DIAGNOSIS Diagnosis   11-20-17 1. Consult Slide , Right Breast @ 10 o'clock - BENIGN BREAST TISSUE WITH DENSE FIBROSIS - NO MALIGNANCY IDENTIFIED 2. Consult Slide , Right Breast @ 5:30 - INVASIVE DUCTAL CARCINOMA - CALCIFICATIONS - SEE COMMENTSpecimen Gross and Clinical Inform Receptor Status: ER(100 % +), PR (40 % +), Her2-neu - by FISH ratio of 1.29  ), Ki-()  Did patient present with symptoms (if so, please note symptoms) or was this found on screening mammography?:  screening abnormality with asymmetry in the right breast  Past/Anticipated interventions by surgeon, if any  FINAL DIAGNOSIS Diagnosis  12-06-17 Dr. Stark Klein 1. Breast, lumpectomy, right with radioactive seed - INVASIVE DUCTAL CARCINOMA, 1.4 CM, NOTTINGHAM GRADE II OF III. - MARGINS OF RESECTION ARE NOT INVOLVED (CLOSEST MARGIN: 1 MM, POSTERIOR). - SEE ONCOLOGY TABLE. 2. Breast, excision, additional right posterior margin - FIBROFATTY BREAST TISSUE, NEGATIVE  FOR CARCINOMA. 3. Breast, excision, additional right inferior margin - FIBROFATTY BREAST TISSUE, NEGATIVE FOR CARCINOMA. 4. Lymph node, sentinel, biopsy, right axillary #1 - ONE LYMPH NODE, NEGATIVE FOR CARCINOMA (0/1). 5. Lymph node, sentinel, biopsy, right axillary #2 - ONE LYMPH NODE, NEGATIVE FOR CARCINOMA (0/1). 6. Lymph node, sentinel, biopsy, right axillary #3 - ONE LYMPH NODE, NEGATIVE FOR CARCINOMA (0/1). 7. Lymph node, sentinel, biopsy, right axillary #4 - ONE LYMPH NODE, NEGATIVE FOR CARCINOMA (0/1). 8. Lymph node, sentinel, biopsy, right axillary #5 - ONE LYMPH NODE, NEGATIVE FOR CARCINOMA (0/1). Microscopic Comment 1. INVASIVE CARCINOMA OF THE BREAST: Resection Procedure: Lumpectomy Specimen Laterality: Right  Receptor Status: ER(100 % +), PR (40 % +), Her2-neu - by FISH ratio of 1.29), Ki-()  Past/Anticipated interventions by medical oncology, if any: Dr. Jana Hakim  Chemotherapy   12-06-17 Oncotype Testing Results 19   Adjuvant radiotherapy  Antiestrogen therapy  Lymphedema issues, if any:   None ROM to right arm Skin to right breast Follow up visit to see Dr.  Pain issues, if any: No pain just tenderness  SAFETY ISSUES:  Prior radiation? No  Pacemaker/ICD? No  Possible current pregnancy? Hysterectomy       Is the patient on methotrexate? No   GYNECOLOGIC HISTORY:  No LMP recorded. Patient has had a hysterectomy. Menarche: 77 years old Age at first live birth: 77 years old She is GXP4.  She is status post patial hysterectomy without BSO. Her LMP was around age 30. She used HRT for 10 years. She used oral contraception for 1  year in 1969.   Vitals:   01/08/18 1436  BP: 107/74  Pulse: 93  Resp: 18  Temp: 98.1 F (36.7 C)  TempSrc: Oral  SpO2: 96%  Weight: 149 lb 9.6 oz (67.9 kg)   Wt Readings from Last 3 Encounters:  01/08/18 149 lb 9.6 oz (67.9 kg)  12/21/17 151 lb 3.2 oz (68.6 kg)  12/06/17 148 lb 1.6 oz (67.2 kg)    Current Complaints  / other details:      Georgena Spurling, RN 12/26/2017,4:53 PM

## 2017-12-27 ENCOUNTER — Telehealth: Payer: Self-pay | Admitting: *Deleted

## 2017-12-27 NOTE — Telephone Encounter (Signed)
Correction to previous oncotype score. Results are 19/6% not 12/6%.

## 2018-01-03 ENCOUNTER — Ambulatory Visit: Payer: Medicare Other | Admitting: Physical Therapy

## 2018-01-07 ENCOUNTER — Encounter: Payer: Self-pay | Admitting: Physical Therapy

## 2018-01-07 ENCOUNTER — Ambulatory Visit: Payer: Medicare Other | Attending: General Surgery | Admitting: Physical Therapy

## 2018-01-07 ENCOUNTER — Other Ambulatory Visit: Payer: Self-pay

## 2018-01-07 DIAGNOSIS — R293 Abnormal posture: Secondary | ICD-10-CM | POA: Insufficient documentation

## 2018-01-07 DIAGNOSIS — Z483 Aftercare following surgery for neoplasm: Secondary | ICD-10-CM

## 2018-01-07 DIAGNOSIS — C50311 Malignant neoplasm of lower-inner quadrant of right female breast: Secondary | ICD-10-CM | POA: Insufficient documentation

## 2018-01-07 DIAGNOSIS — Z170 Estrogen receptor positive status [ER+]: Secondary | ICD-10-CM | POA: Insufficient documentation

## 2018-01-07 NOTE — Patient Instructions (Addendum)
Axillary web syndrome (also called cording) can happen after having breast cancer surgery when lymph nodes in the armpit are removed. It presents as if you have a thin cord in your arm and can run from the armpit all the way down into the forearm. If you've had a sentinel node biopsy, the risk is 1-20% and if you've had an axillary lymph node dissection (more than 7 nodes removed), the risk is 36-72%. The ranges vary depending on the research study.  It most often happens 3-4 weeks post-op but can happen sooner or later. There are several possibilities for what cording actually is. Although no one knows for sure as of yet, it may be related to lymphatics, veins, or other tissue. Sometimes cording resolves on its own but other times it requires physical therapy with a therapist who specializes in lymphedema and/or cancer rehab. Treatment typically involves stretching, manual techniques, and exercise. Sometimes cords get "released" while stretching or during manual treatment and the patient may experience the sensation of a "pop." This may feel strange but it is not dangerous and is a sign that the cord has released; range of motion may be improved in the process.   MEDIAN NERVE: Mobilization XI    Stand with right palm flat on wall, fingers back, elbow bent, head tilted away. Sidestep away from wall, straightening elbow. Do _1__ sets of _3__ repetitions per session. Do _2__ sessions per day. Hold each stretch 5 seconds.  Copyright  VHI. All rights reserved.  Closed Chain: Shoulder Abduction / Adduction - on Wall    One hand on wall, step to side and return. Stepping causes shoulder to abduct and adduct. Step _10__ times, hold 3-5 seconds, _2__ times per day.  http://ss.exer.us/267   Copyright  VHI. All rights reserved.

## 2018-01-07 NOTE — Therapy (Addendum)
[place], [place], [address] Phone: [phone]   Fax:  [phone]  Physical Therapy Treatment  Patient Details  Name: Rebekah Wells MRN: [id number] Date of Birth: 12-Nov-1940 Referring Provider (PT): Dr. Stark Klein   Encounter Date: 01/07/2018  PT End of Session - 01/07/18 1336    Visit Number  2    Number of Visits  2    PT Start Time  1250    PT Stop Time  1340    PT Time Calculation (min)  50 min    Activity Tolerance  Patient tolerated treatment well    Behavior During Therapy  [facility] for tasks assessed/performed       Past Medical History:  Diagnosis Date  . Arthritis    OA  . Asthma   . Cancer ([place])    right breast  . Dysrhythmia    tachycardia  . Gastritis   . GERD (gastroesophageal reflux disease)   . H/O seasonal allergies   . Hyperlipemia     Past Surgical History:  Procedure Laterality Date  . ABDOMINAL HYSTERECTOMY    . BREAST LUMPECTOMY WITH RADIOACTIVE SEED AND SENTINEL LYMPH NODE BIOPSY Right 12/06/2017   Procedure: RIGHT BREAST LUMPECTOMY WITH RADIOACTIVE SEED AND SENTINEL LYMPH NODE BIOPSY;  Surgeon: Stark Klein, MD;  Location: [facility];  Service: General;  Laterality: Right;  . CARPAL TUNNEL RELEASE Left 07/09/2014   Procedure: LEFT CARPAL TUNNEL RELEASE;  Surgeon: Daryll Brod, MD;  Location: [facility];  Service: Orthopedics;  Laterality: Left;  . CESAREAN SECTION    . CHOLECYSTECTOMY    . DG TOES RT FOOT MULTIPLE SPECIF ([place] HX) Right   . DIAGNOSTIC LAPAROSCOPY    . DIGIT NAIL REMOVAL Right 12/05/2016   Procedure: REMOVAL OF SCREW RIGHT GREAT TOE;  Surgeon: Paralee Cancel, MD;  Location: [facility];  Service: Orthopedics;  Laterality: Right;  . EYE SURGERY     bilateral cataract with lens implants  . HAND ARTHROPLASTY Right    for OA  . KNEE ARTHROSCOPY     x3  . NISSEN FUNDOPLICATION    . TONSILLECTOMY    . TOTAL KNEE ARTHROPLASTY Right  12/05/2016   Procedure: RIGHT TOTAL KNEE ARTHROPLASTY;  Surgeon: Paralee Cancel, MD;  Location: [facility];  Service: Orthopedics;  Laterality: Right;  90 mins  . TOTAL KNEE ARTHROPLASTY Left 03/20/2017   Procedure: LEFT TOTAL KNEE ARTHROPLASTY;  Surgeon: Paralee Cancel, MD;  Location: [facility];  Service: Orthopedics;  Laterality: Left;  70 mins    There were no vitals filed for this visit.  Subjective Assessment - 01/07/18 1253    Subjective  Patient underwent a right lumpectomy and sentinel node biopsy (0/5 nodes positive) on 12/06/17. She started Tamoxifen 2 weeks ago and reports some aching muscles since starting that. No need for chemotherapy but will begin radiation in January. She lives 3 hours from [facility].    Patient is accompained by:  Family member    Pertinent History  Patient was diagnosed on 09/27/17 with right grade II invasive ductal carcinoma breast cancer. Patient underwent a right lumpectomy and sentinel node biopsy (0/5 nodes positive) on 12/06/17. It is ER/PR positive and HER2 negative. She had a right total knee replacement in 11/2016 and a left in 02/2017.     Patient Stated Goals  See if my arm is doing ok.    Currently in Pain?  Yes    Pain Score  3  Pain Location  Axilla    Pain Orientation  Right    Pain Descriptors / Indicators  Tightness    Pain Type  Surgical pain    Pain Onset  More than a month ago    Pain Frequency  Intermittent    Aggravating Factors   Wearing bra too long    Pain Relieving Factors  Taking bra off    Multiple Pain Sites  No         [facility] PT Assessment - 01/07/18 0001      Assessment   Medical Diagnosis  s/p right lumpectomy and SLNB    Referring Provider (PT)  Dr. Stark Klein    Onset Date/Surgical Date  12/06/17    Hand Dominance  Right    Prior Therapy  Baseline assessments      Precautions   Precautions  Other (comment)    Precaution Comments  recent breast surgery      Restrictions   Weight Bearing Restrictions  No       Balance Screen   Has the patient fallen in the past 6 months  No    Has the patient had a decrease in activity level because of a fear of falling?   No    Is the patient reluctant to leave their home because of a fear of falling?   No      Home Environment   Living Environment  Private residence    Living Arrangements  Spouse/significant other    Available Help at Discharge  Family      Prior Function   Level of [place]  Retired    Leisure  She does silver sneakers classes 3x/week for 1 hour and chair yoga once a week. She returned to yoga 12/28/17 but has not returned yet to other exercise.      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 Incisions appear to be healed well.      Posture/Postural Control   Posture/Postural Control  Postural limitations    Postural Limitations  Rounded Shoulders;Forward head;Increased thoracic kyphosis      ROM / Strength   AROM / PROM / Strength  AROM      AROM   AROM Assessment Site  Shoulder    Right/Left Shoulder  Right    Right Shoulder Extension  46 Degrees    Right Shoulder Flexion  142 Degrees    Right Shoulder ABduction  155 Degrees    Right Shoulder Internal Rotation  59 Degrees    Right Shoulder External Rotation  72 Degrees        LYMPHEDEMA/ONCOLOGY QUESTIONNAIRE - 01/07/18 1302      Type   Cancer Type  Right breast      Surgeries   Lumpectomy Date  12/06/17    Sentinel Lymph Node Biopsy Date  12/06/17    Number Lymph Nodes Removed  5      Treatment   Active Chemotherapy Treatment  No    Past Chemotherapy Treatment  No    Active Radiation Treatment  No    Past Radiation Treatment  No    Current Hormone Treatment  Yes    Date  12/26/17    Drug Name  Tamoxifen    Past Hormone Therapy  No  What other symptoms do you have    Are you Having Heaviness or Tightness  Yes    Are you having Pain  Yes    Are you having pitting edema  No    Is it Hard or Difficult finding clothes that fit  No    Do you have infections  No    Is there Decreased scar mobility  Yes    [place] Sign  No      Lymphedema Assessments   Lymphedema Assessments  Upper extremities      Right Upper Extremity Lymphedema   10 cm Proximal to Olecranon Process  24.6 cm    Olecranon Process  21.5 cm    10 cm Proximal to Ulnar Styloid Process  20 cm    Just Proximal to Ulnar Styloid Process  14.1 cm    Across Hand at [employer]  17.7 cm    At [place] of 2nd Digit  5.9 cm      Left Upper Extremity Lymphedema   10 cm Proximal to Olecranon Process  24.2 cm    Olecranon Process  21.5 cm    10 cm Proximal to Ulnar Styloid Process  18.9 cm    Just Proximal to Ulnar Styloid Process  13.9 cm    Across Hand at [employer]  17.7 cm    At [place] of 2nd Digit  5.5 cm        Quick Dash - 01/07/18 0001    Open a tight or new jar  Moderate difficulty    Do heavy household chores (wash walls, wash floors)  Mild difficulty    Carry a shopping bag or briefcase  Mild difficulty    Wash your back  Mild difficulty    Use a knife to cut food  Mild difficulty    Recreational activities in which you take some force or impact through your arm, shoulder, or hand (golf, hammering, tennis)  Mild difficulty    During the past week, to what extent has your arm, shoulder or hand problem interfered with your normal social activities with family, friends, neighbors, or groups?  Slightly    During the past week, to what extent has your arm, shoulder or hand problem limited your work or other regular daily activities  Slightly    Arm, shoulder, or hand pain.  Mild    Tingling (pins and needles) in your arm, shoulder, or hand  None    Difficulty Sleeping  Mild difficulty    DASH Score  25 %                     PT Education - 01/07/18 1335    Education  Details  Lymphedema risk reduction and post op ROM including neural stretching    Person(s) Educated  Spouse;Patient    Methods  Explanation;Demonstration;Handout    Comprehension  Returned demonstration;Verbalized understanding          PT Long Term Goals - 01/07/18 1341      PT LONG TERM GOAL #1   Title  Patient will demonstrate she has regained shoulder ROM and strength compared ot baselines measurements when seen post operatively.    Time  8    Period  Weeks    Status  Achieved            Plan - 01/07/18 1336    Clinical Impression Statement  Patient is doing very well s/p a right lumpectomy and SLNB (0/5  nodes positive) on 12/06/17. She has mild cording in her right axilla but was instructed in a HEP to address that. She has no other needs for PT at this time.    Rehab Potential  Excellent    PT Treatment/Interventions  ADLs/Self Care Home Management;Therapeutic exercise;Patient/family education    PT Next Visit Plan  D/C    PT Home Exercise Plan  Neural stretching and shoulder ROM    Recommended Other Services  Patient was seen today by the [facility] fitter for a fitting for a class I (20-30 mmHg) compression sleeve for flying. This will be covered by [employer] as she has [employer].    Consulted and Agree with Plan of Care  Patient;Family member/caregiver    Family Member Consulted  Husband       Patient will benefit from skilled therapeutic intervention in order to improve the following deficits and impairments:  Postural dysfunction, Decreased range of motion, Decreased knowledge of precautions, Impaired UE functional use, Pain  Visit Diagnosis: Malignant neoplasm of lower-inner quadrant of right breast of female, estrogen receptor positive (HCC)  Abnormal posture  Aftercare following surgery for neoplasm     Problem List Patient Active Problem List   Diagnosis Date Noted  . Malignant neoplasm of lower-inner quadrant of right breast of female, estrogen  receptor positive ([place]) 11/16/2017  . Overweight (BMI 25.0-29.9) 03/21/2017  . 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 S/P left TKA 03/20/2017   PHYSICAL THERAPY DISCHARGE SUMMARY  Visits from Start of Care: 2  Current functional level related to goals / functional outcomes: See above for objective findings. Goals met.   Remaining deficits: Mild cording in right axilla   Education / Equipment: Lymphedema risk reduction education; HEP  Plan: Patient agrees to discharge.  Patient goals were met. Patient is being discharged due to meeting the stated rehab goals.  ?????         Annia Friendly, [place] 01/07/18 1:47 PM   [place], [place], [address] Phone: [phone]   Fax:  [phone]  Name: Rebekah Wells MRN: [id number] Date of Birth: 1940-05-31

## 2018-01-08 ENCOUNTER — Ambulatory Visit
Admission: RE | Admit: 2018-01-08 | Discharge: 2018-01-08 | Disposition: A | Discharge status: Home / Self Care | Payer: Medicare Other | Source: Ambulatory Visit | Attending: Radiation Oncology | Admitting: Radiation Oncology

## 2018-01-08 ENCOUNTER — Other Ambulatory Visit: Payer: Self-pay

## 2018-01-08 ENCOUNTER — Ambulatory Visit: Payer: Medicare Other | Admitting: Radiation Oncology

## 2018-01-08 ENCOUNTER — Encounter: Payer: Self-pay | Admitting: Radiation Oncology

## 2018-01-08 VITALS — BP 107/74 | HR 93 | Temp 98.1°F | Resp 18 | Wt 149.6 lb

## 2018-01-08 DIAGNOSIS — Z170 Estrogen receptor positive status [ER+]: Principal | ICD-10-CM

## 2018-01-08 DIAGNOSIS — C50311 Malignant neoplasm of lower-inner quadrant of right female breast: Secondary | ICD-10-CM

## 2018-01-09 ENCOUNTER — Ambulatory Visit
Admission: RE | Admit: 2018-01-09 | Discharge: 2018-01-09 | Disposition: A | Discharge status: Home / Self Care | Payer: Medicare Other | Source: Ambulatory Visit | Attending: Radiation Oncology | Admitting: Radiation Oncology

## 2018-01-09 DIAGNOSIS — Z170 Estrogen receptor positive status [ER+]: Secondary | ICD-10-CM | POA: Insufficient documentation

## 2018-01-09 DIAGNOSIS — C50311 Malignant neoplasm of lower-inner quadrant of right female breast: Secondary | ICD-10-CM

## 2018-01-09 NOTE — Addendum Note (Signed)
Encounter addended by: Hayden Pedro, PA-C on: 01/09/2018 12:37 PM  Actions taken: LOS modified

## 2018-01-09 NOTE — Progress Notes (Signed)
Radiation Oncology         [phone] ________________________________  Name: Rebekah Wells        MRN: [id number]  Date of Service: 01/08/2018 DOB: Dec 05, 1940  CC:Rebekah Grumbles, MD  Rebekah Klein, MD     REFERRING PHYSICIAN: Stark Klein, MD   DIAGNOSIS: The encounter diagnosis was Malignant neoplasm of lower-inner quadrant of right breast of female, estrogen receptor positive ([place]).   HISTORY OF PRESENT ILLNESS: Rebekah Wells is a 77 y.o. female originally seen in the multidisciplinary breast clinic for a new diagnosis of right breast cancer. The patient was noted to have a screening abnormality with asymmetry in the right breast.  She was seen at [facility] through currently in facility.  In the upper outer quadrant at 10:00 there was a 3 x 8 mm mass seen on diagnostic imaging, and in the lower inner quadrant at 530 and 9 x 9 x 8 mm lesion.  There was also an abnormality in her axillary lymph nodes.  She underwent a biopsy on 10/26/17 in [place], [place].  Final pathology was (also re-read) at the 10 o'clock position revealed revealed benign features but significant fibrosis that appeared concerning and discordant with the original radiologic assessment.  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  No biopsies were obtained of her axillary node. She did go on to have an MRI of bilateral breasts on 11/28/2017 revealing a 1 x 1 x 1 cm enhancing mass consistent with her known biopsy site.  There was no evidence of any abnormalities within the left breast, and no abnormal appearing lymph nodes in either axilla.  She subsequently underwent lumpectomy on 12/06/2017 with sentinel lymph node biopsy.  Final pathology reveals a grade 1-2 invasive ductal carcinoma spanning 1.4 cm, 1 mm from the posterior margin. She had 5 sampled lymph nodes all of which were negative, and her tumor was ER PR positive,  HER-2 negative.  Her tumor was tested for Oncotype and was found to be 19, she will not receive chemotherapy.  She comes today to discuss options of radiotherapy in the adjuvant setting.    PREVIOUS RADIATION THERAPY: No   PAST MEDICAL HISTORY:  Past Medical History:  Diagnosis Date  . Arthritis    OA  . Asthma   . Cancer ([place])    right breast  . Dysrhythmia    tachycardia  . Gastritis   . GERD (gastroesophageal reflux disease)   . H/O seasonal allergies   . Hyperlipemia        PAST SURGICAL HISTORY: Past Surgical History:  Procedure Laterality Date  . ABDOMINAL HYSTERECTOMY    . BREAST LUMPECTOMY WITH RADIOACTIVE SEED AND SENTINEL LYMPH NODE BIOPSY Right 12/06/2017   Procedure: RIGHT BREAST LUMPECTOMY WITH RADIOACTIVE SEED AND SENTINEL LYMPH NODE BIOPSY;  Surgeon: Rebekah Klein, MD;  Location: [facility];  Service: General;  Laterality: Right;  . CARPAL TUNNEL RELEASE Left 07/09/2014   Procedure: LEFT CARPAL TUNNEL RELEASE;  Surgeon: Rebekah Brod, MD;  Location: [facility];  Service: Orthopedics;  Laterality: Left;  . CESAREAN SECTION    . CHOLECYSTECTOMY    . DG TOES RT FOOT MULTIPLE SPECIF ([place] HX) Right   . DIAGNOSTIC LAPAROSCOPY    . DIGIT NAIL REMOVAL Right 12/05/2016   Procedure: REMOVAL OF SCREW RIGHT GREAT TOE;  Surgeon: Rebekah Cancel, MD;  Location: [facility];  Service: Orthopedics;  Laterality: Right;  . EYE SURGERY     bilateral cataract with lens implants  . HAND ARTHROPLASTY Right    for OA  . KNEE ARTHROSCOPY     x3  . NISSEN FUNDOPLICATION    . TONSILLECTOMY    . TOTAL KNEE ARTHROPLASTY Right 12/05/2016   Procedure: RIGHT TOTAL KNEE ARTHROPLASTY;  Surgeon: Rebekah Cancel, MD;  Location: [facility];  Service: Orthopedics;  Laterality: Right;  90 mins  . TOTAL KNEE ARTHROPLASTY Left 03/20/2017   Procedure: LEFT TOTAL KNEE ARTHROPLASTY;  Surgeon: Rebekah Cancel, MD;  Location: [facility];  Service: Orthopedics;  Laterality: Left;  70 mins     FAMILY  HISTORY:  Family History  Problem Relation Age of Onset  . Colon cancer Mother   . CAD Father 75  . Lung cancer Sister      SOCIAL HISTORY:  reports that she has never smoked. She has never used smokeless tobacco. She reports that she does not drink alcohol or use drugs. The patient is married and lives in [place]. Her daughter is a rheumatologist in town. The patient stays with her daughter when she is in [place].   ALLERGIES: Penicillins; Other; Silver; and Sulfa antibiotics   MEDICATIONS:  Current Outpatient Medications  Medication Sig Dispense Refill  . albuterol (PROVENTIL HFA;VENTOLIN HFA) 108 (90 BASE) MCG/ACT inhaler Inhale 1-2 puffs into the lungs every 6 (six) hours as needed for wheezing or shortness of breath.     . beta carotene w/minerals (OCUVITE) tablet Take 1 tablet by mouth 4 (four) times a week. At night.    . Calcium Carb-Cholecalciferol (CALCIUM 600+D3 PO) Take 1 tablet by mouth at bedtime.    . cetirizine (ZYRTEC) 10 MG tablet Take 10 mg by mouth at bedtime as needed for allergies.     . cholecalciferol (VITAMIN D) 1000 units tablet Take 1 tablet (1,000 Units total) by mouth daily. 90 tablet 4  . Fluticasone-Salmeterol (ADVAIR) 250-50 MCG/DOSE AEPB Inhale 1 puff into the lungs daily.    . meloxicam (MOBIC) 15 MG tablet Take 15 mg by mouth daily as needed for pain.    . metoprolol succinate (TOPROL-XL) 25 MG 24 hr tablet Take 25 mg by mouth at bedtime.     Marland Kitchen omeprazole (PRILOSEC) 20 MG capsule Take 20 mg by mouth daily as needed (for acid reflex).     . simvastatin (ZOCOR) 40 MG tablet Take 40 mg by mouth every evening.     . tamoxifen (NOLVADEX) 20 MG tablet Take 1 tablet (20 mg total) by mouth daily. 90 tablet 12  . traZODone (DESYREL) 50 MG tablet Take 25 mg by mouth at bedtime as needed for sleep.    Marland Kitchen HYDROcodone-acetaminophen (NORCO/VICODIN) 5-325 MG tablet Take 1 tablet by mouth every 6 (six) hours as needed for moderate pain. (Patient not taking:  Reported on 01/08/2018) 20 tablet 0   No current facility-administered medications for this encounter.      REVIEW OF SYSTEMS: On review of systems, the patient reports that she is doing well overall.  She does state that she has had some soreness along her right chest wall laterally since her surgery, but has been doing pretty well otherwise.  She denies any sternal chest pain, shortness of breath, cough, fevers, chills, night sweats, unintended weight changes. She denies any bowel or bladder disturbances, and denies abdominal pain, nausea or vomiting. She denies any new musculoskeletal or joint aches or pains. A complete review of systems is obtained and is otherwise  negative.     PHYSICAL EXAM:  Wt Readings from Last 3 Encounters:  01/08/18 149 lb 9.6 oz (67.9 kg)  12/21/17 151 lb 3.2 oz (68.6 kg)  12/06/17 148 lb 1.6 oz (67.2 kg)   Temp Readings from Last 3 Encounters:  01/08/18 98.1 F (36.7 C) (Oral)  12/21/17 98 F (36.7 C) (Oral)  12/06/17 97.9 F (36.6 C)   BP Readings from Last 3 Encounters:  01/08/18 107/74  12/21/17 114/77  12/06/17 (!) 144/67   Pulse Readings from Last 3 Encounters:  01/08/18 93  12/21/17 91  12/06/17 76     In general this is a well appearing caucasian female in no acute distress. She is alert and oriented x4 and appropriate throughout the examination. HEENT reveals that the patient is normocephalic, atraumatic. EOMs are intact. Skin is intact without any evidence of gross lesions. Cardiopulmonary assessment is negative for acute distress and she exhibits normal effort.  Her right breast incision is healing nicely without evidence of erythema or edema of the chest wall or right upper extremity.   ECOG = 0  0 - Asymptomatic (Fully active, able to carry on all predisease activities without restriction)  1 - Symptomatic but completely ambulatory (Restricted in physically strenuous activity but ambulatory and able to carry out work of a light or  sedentary nature. For example, light housework, office work)  2 - Symptomatic, <50% in bed during the day (Ambulatory and capable of all self care but unable to carry out any work activities. Up and about more than 50% of waking hours)  3 - Symptomatic, >50% in bed, but not bedbound (Capable of only limited self-care, confined to bed or chair 50% or more of waking hours)  4 - Bedbound (Completely disabled. Cannot carry on any self-care. Totally confined to bed or chair)  5 - Death   Eustace Pen MM, Creech RH, Tormey DC, et al. [phone]). "Toxicity and response criteria of the [facility]". [place] Oncol. 5 (6): 649-55    LABORATORY DATA:  Lab Results  Component Value Date   WBC 6.9 12/05/2017   HGB 12.8 12/05/2017   HCT 43.2 12/05/2017   MCV 91.7 12/05/2017   PLT 259 12/05/2017   Lab Results  Component Value Date   NA 139 12/05/2017   K 3.7 12/05/2017   CL 108 12/05/2017   CO2 22 12/05/2017   Lab Results  Component Value Date   ALT 44 11/21/2017   AST 39 11/21/2017   ALKPHOS 78 11/21/2017   BILITOT 0.4 11/21/2017      RADIOGRAPHY: No results found.     IMPRESSION/PLAN: 1. 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 Dr. Lisbeth Renshaw reviews her final pathology results, and the rationale to consider post surgical radiotherapy to reduce the risk of recurrence.  While the patient's tumor is favorable, and she could potentially consider foregoing radiation, there may be a benefit given her performance status.  We discussed the risks, benefits, short, and long term effects of radiotherapy. Dr. Lisbeth Renshaw discusses the delivery and logistics of radiotherapy and offered the patient a course of 4  weeks of radiotherapy.  At the end of the conversation, the patient is interested in proceeding, and would like to complete treatment before the end of the year of our plan would be to proceed in the next week or two with  radiation. Written consent is obtained and placed in the chart, a copy was provided to  the patient. She return tomorrow at 11 am for simulation.  In a visit lasting 25 minutes, greater than 50% of the time was spent face to face discussing her case, and coordinating the patient's care.   The above documentation reflects my direct findings during this shared patient visit. Please see the separate note by Dr. Lisbeth Renshaw on this date for the remainder of the patient's plan of care.    Carola Rhine, PAC

## 2018-01-09 NOTE — Progress Notes (Signed)
  Radiation Oncology         [phone] ________________________________  Name: Quetzali Heinle MRN: [id number]  Date: 01/09/2018  DOB: 05/09/1940   DIAGNOSIS:     ICD-10-CM   1. Malignant neoplasm of lower-inner quadrant of right breast of female, estrogen receptor positive ([place]) C50.311    Z17.0     SIMULATION AND TREATMENT PLANNING NOTE  The patient presented for simulation prior to beginning her course of radiation treatment for her diagnosis of right-sided breast cancer. The patient was placed in a supine position on a breast board. A customized vac-lock bag was constructed and this complex treatment device will be used on a daily basis during her treatment. In this fashion, a CT scan was obtained through the chest area and an isocenter was placed near the chest wall within the breast.  The patient will be planned to receive a course of radiation initially to a dose of 42.56 Gy. This will consist of a whole breast radiotherapy technique. To accomplish this, 2 customized blocks have been designed which will correspond to medial and lateral whole breast tangent fields. This treatment will be accomplished at 2.66 Gy per fraction. A forward planning technique will also be evaluated to determine if this approach improves the plan. It is anticipated that the patient will then receive a 8 Gy boost to the seroma cavity which has been contoured. This will be accomplished at 2 Gy per fraction.   This initial treatment will consist of a 3-D conformal technique. The seroma has been contoured as the primary target structure. Additionally, dose volume histograms of both this target as well as the lungs and heart will also be evaluated. Such an approach is necessary to ensure that the target area is adequately covered while the nearby critical  normal structures are adequately spared.  Plan:  The final anticipated total dose therefore will correspond to 50.56  Gy.    _______________________________   Jodelle Gross, MD, PhD

## 2018-01-09 NOTE — Progress Notes (Signed)
  Radiation Oncology         [phone]) [phone] ________________________________  Name: Rebekah Wells MRN: [id number]  Date: 01/09/2018  DOB: [birth year]  Optical Surface Tracking Plan:  Since intensity modulated radiotherapy (IMRT) and 3D conformal radiation treatment methods are predicated on accurate and precise positioning for treatment, intrafraction motion monitoring is medically necessary to ensure accurate and safe treatment delivery.  The ability to quantify intrafraction motion without excessive ionizing radiation dose can only be performed with optical surface tracking. Accordingly, surface imaging offers the opportunity to obtain 3D measurements of patient position throughout IMRT and 3D treatments without excessive radiation exposure.  I am ordering optical surface tracking for this patient's upcoming course of radiotherapy. ________________________________  Kyung Rudd, MD 01/09/2018 4:09 PM    Reference:   Ursula Alert, J, et al. Surface imaging-based analysis of intrafraction motion for breast radiotherapy patients.Journal of [place], n. 6, nov. 2014. ISSN [id number].   Available at: <http://www.jacmp.org/index.php/jacmp/article/view/4957>.

## 2018-01-12 DIAGNOSIS — C50311 Malignant neoplasm of lower-inner quadrant of right female breast: Secondary | ICD-10-CM | POA: Diagnosis not present

## 2018-01-21 ENCOUNTER — Ambulatory Visit
Admission: RE | Admit: 2018-01-21 | Discharge: 2018-01-21 | Disposition: A | Discharge status: Home / Self Care | Payer: Medicare Other | Source: Ambulatory Visit | Attending: Radiation Oncology | Admitting: Radiation Oncology

## 2018-01-21 DIAGNOSIS — Z170 Estrogen receptor positive status [ER+]: Secondary | ICD-10-CM | POA: Diagnosis not present

## 2018-01-21 DIAGNOSIS — Z510 Encounter for antineoplastic radiation therapy: Secondary | ICD-10-CM | POA: Diagnosis not present

## 2018-01-21 DIAGNOSIS — C50311 Malignant neoplasm of lower-inner quadrant of right female breast: Secondary | ICD-10-CM | POA: Insufficient documentation

## 2018-01-22 ENCOUNTER — Telehealth: Payer: Self-pay | Admitting: Oncology

## 2018-01-22 ENCOUNTER — Ambulatory Visit
Admission: RE | Admit: 2018-01-22 | Discharge: 2018-01-22 | Disposition: A | Discharge status: Home / Self Care | Payer: Medicare Other | Source: Ambulatory Visit | Attending: Radiation Oncology | Admitting: Radiation Oncology

## 2018-01-22 DIAGNOSIS — Z510 Encounter for antineoplastic radiation therapy: Secondary | ICD-10-CM | POA: Diagnosis not present

## 2018-01-22 DIAGNOSIS — Z170 Estrogen receptor positive status [ER+]: Principal | ICD-10-CM

## 2018-01-22 DIAGNOSIS — C50311 Malignant neoplasm of lower-inner quadrant of right female breast: Secondary | ICD-10-CM

## 2018-01-22 MED ORDER — RADIAPLEXRX EX GEL
Freq: Once | CUTANEOUS | Status: AC
  Administered 2018-01-22: 14:00:00 via TOPICAL

## 2018-01-22 MED ORDER — ALRA NON-METALLIC DEODORANT (RAD-ONC)
1.0000 "application " | Freq: Once | TOPICAL | Status: AC
  Administered 2018-01-22: 1 via TOPICAL

## 2018-01-22 NOTE — Progress Notes (Signed)
Pt here for patient teaching.  Pt given Radiation and You booklet, skin care instructions, Alra deodorant and Radiaplex gel.  Reviewed areas of pertinence such as fatigue, hair loss, skin changes, breast tenderness and breast swelling . Pt able to give teach back of to pat skin and use unscented/gentle soap,apply Radiaplex bid, avoid applying anything to skin within 4 hours of treatment, avoid wearing an under wire bra and to use an electric razor if they must shave. Pt verbalizes understanding of information given and will contact nursing with any questions or concerns.     Nadea Kirkland M. Suzanna Zahn RN, BSN      

## 2018-01-22 NOTE — Telephone Encounter (Signed)
Gave calendar  °

## 2018-01-23 ENCOUNTER — Ambulatory Visit
Admission: RE | Admit: 2018-01-23 | Discharge: 2018-01-23 | Disposition: A | Discharge status: Home / Self Care | Payer: Medicare Other | Source: Ambulatory Visit | Attending: Radiation Oncology | Admitting: Radiation Oncology

## 2018-01-23 DIAGNOSIS — Z510 Encounter for antineoplastic radiation therapy: Secondary | ICD-10-CM | POA: Diagnosis not present

## 2018-01-24 ENCOUNTER — Ambulatory Visit
Admission: RE | Admit: 2018-01-24 | Discharge: 2018-01-24 | Disposition: A | Discharge status: Home / Self Care | Payer: Medicare Other | Source: Ambulatory Visit | Attending: Radiation Oncology | Admitting: Radiation Oncology

## 2018-01-24 DIAGNOSIS — Z510 Encounter for antineoplastic radiation therapy: Secondary | ICD-10-CM | POA: Diagnosis not present

## 2018-01-25 ENCOUNTER — Ambulatory Visit
Admission: RE | Admit: 2018-01-25 | Discharge: 2018-01-25 | Disposition: A | Discharge status: Home / Self Care | Payer: Medicare Other | Source: Ambulatory Visit | Attending: Radiation Oncology | Admitting: Radiation Oncology

## 2018-01-25 DIAGNOSIS — Z510 Encounter for antineoplastic radiation therapy: Secondary | ICD-10-CM | POA: Diagnosis not present

## 2018-01-28 ENCOUNTER — Ambulatory Visit
Admission: RE | Admit: 2018-01-28 | Discharge: 2018-01-28 | Disposition: A | Discharge status: Home / Self Care | Payer: Medicare Other | Source: Ambulatory Visit | Attending: Radiation Oncology | Admitting: Radiation Oncology

## 2018-01-28 DIAGNOSIS — Z510 Encounter for antineoplastic radiation therapy: Secondary | ICD-10-CM | POA: Diagnosis not present

## 2018-01-29 ENCOUNTER — Ambulatory Visit
Admission: RE | Admit: 2018-01-29 | Discharge: 2018-01-29 | Disposition: A | Discharge status: Home / Self Care | Payer: Medicare Other | Source: Ambulatory Visit | Attending: Radiation Oncology | Admitting: Radiation Oncology

## 2018-01-29 DIAGNOSIS — Z510 Encounter for antineoplastic radiation therapy: Secondary | ICD-10-CM | POA: Diagnosis not present

## 2018-01-30 ENCOUNTER — Ambulatory Visit
Admission: RE | Admit: 2018-01-30 | Discharge: 2018-01-30 | Disposition: A | Discharge status: Home / Self Care | Payer: Medicare Other | Source: Ambulatory Visit | Attending: Radiation Oncology | Admitting: Radiation Oncology

## 2018-01-30 DIAGNOSIS — Z510 Encounter for antineoplastic radiation therapy: Secondary | ICD-10-CM | POA: Diagnosis not present

## 2018-01-31 ENCOUNTER — Ambulatory Visit
Admission: RE | Admit: 2018-01-31 | Discharge: 2018-01-31 | Disposition: A | Discharge status: Home / Self Care | Payer: Medicare Other | Source: Ambulatory Visit | Attending: Radiation Oncology | Admitting: Radiation Oncology

## 2018-01-31 DIAGNOSIS — Z510 Encounter for antineoplastic radiation therapy: Secondary | ICD-10-CM | POA: Diagnosis not present

## 2018-02-01 ENCOUNTER — Ambulatory Visit
Admission: RE | Admit: 2018-02-01 | Discharge: 2018-02-01 | Disposition: A | Discharge status: Home / Self Care | Payer: Medicare Other | Source: Ambulatory Visit | Attending: Radiation Oncology | Admitting: Radiation Oncology

## 2018-02-01 DIAGNOSIS — Z510 Encounter for antineoplastic radiation therapy: Secondary | ICD-10-CM | POA: Diagnosis not present

## 2018-02-04 ENCOUNTER — Ambulatory Visit
Admission: RE | Admit: 2018-02-04 | Discharge: 2018-02-04 | Disposition: A | Discharge status: Home / Self Care | Payer: Medicare Other | Source: Ambulatory Visit | Attending: Radiation Oncology | Admitting: Radiation Oncology

## 2018-02-04 DIAGNOSIS — Z510 Encounter for antineoplastic radiation therapy: Secondary | ICD-10-CM | POA: Diagnosis not present

## 2018-02-05 ENCOUNTER — Ambulatory Visit
Admission: RE | Admit: 2018-02-05 | Discharge: 2018-02-05 | Disposition: A | Discharge status: Home / Self Care | Payer: Medicare Other | Source: Ambulatory Visit | Attending: Radiation Oncology | Admitting: Radiation Oncology

## 2018-02-05 DIAGNOSIS — Z510 Encounter for antineoplastic radiation therapy: Secondary | ICD-10-CM | POA: Diagnosis not present

## 2018-02-06 ENCOUNTER — Ambulatory Visit
Admission: RE | Admit: 2018-02-06 | Discharge: 2018-02-06 | Disposition: A | Discharge status: Home / Self Care | Payer: Medicare Other | Source: Ambulatory Visit | Attending: Radiation Oncology | Admitting: Radiation Oncology

## 2018-02-06 DIAGNOSIS — Z510 Encounter for antineoplastic radiation therapy: Secondary | ICD-10-CM | POA: Diagnosis not present

## 2018-02-07 ENCOUNTER — Ambulatory Visit
Admission: RE | Admit: 2018-02-07 | Discharge: 2018-02-07 | Disposition: A | Discharge status: Home / Self Care | Payer: Medicare Other | Source: Ambulatory Visit | Attending: Radiation Oncology | Admitting: Radiation Oncology

## 2018-02-07 DIAGNOSIS — Z510 Encounter for antineoplastic radiation therapy: Secondary | ICD-10-CM | POA: Diagnosis not present

## 2018-02-08 ENCOUNTER — Ambulatory Visit: Payer: Medicare Other | Admitting: Radiation Oncology

## 2018-02-08 ENCOUNTER — Ambulatory Visit
Admission: RE | Admit: 2018-02-08 | Discharge: 2018-02-08 | Disposition: A | Discharge status: Home / Self Care | Payer: Medicare Other | Source: Ambulatory Visit | Attending: Radiation Oncology | Admitting: Radiation Oncology

## 2018-02-08 DIAGNOSIS — Z510 Encounter for antineoplastic radiation therapy: Secondary | ICD-10-CM | POA: Diagnosis not present

## 2018-02-11 ENCOUNTER — Ambulatory Visit: Payer: Medicare Other

## 2018-02-11 ENCOUNTER — Ambulatory Visit
Admission: RE | Admit: 2018-02-11 | Discharge: 2018-02-11 | Disposition: A | Discharge status: Home / Self Care | Payer: Medicare Other | Source: Ambulatory Visit | Attending: Radiation Oncology | Admitting: Radiation Oncology

## 2018-02-11 DIAGNOSIS — Z510 Encounter for antineoplastic radiation therapy: Secondary | ICD-10-CM | POA: Diagnosis not present

## 2018-02-12 ENCOUNTER — Ambulatory Visit: Payer: Medicare Other

## 2018-02-12 ENCOUNTER — Ambulatory Visit
Admission: RE | Admit: 2018-02-12 | Discharge: 2018-02-12 | Disposition: A | Discharge status: Home / Self Care | Payer: Medicare Other | Source: Ambulatory Visit | Attending: Radiation Oncology | Admitting: Radiation Oncology

## 2018-02-12 DIAGNOSIS — Z510 Encounter for antineoplastic radiation therapy: Secondary | ICD-10-CM | POA: Diagnosis not present

## 2018-02-13 ENCOUNTER — Ambulatory Visit: Payer: Medicare Other

## 2018-02-14 ENCOUNTER — Ambulatory Visit: Payer: Medicare Other

## 2018-02-14 ENCOUNTER — Ambulatory Visit
Admission: RE | Admit: 2018-02-14 | Discharge: 2018-02-14 | Disposition: A | Discharge status: Home / Self Care | Payer: Medicare Other | Source: Ambulatory Visit | Attending: Radiation Oncology | Admitting: Radiation Oncology

## 2018-02-14 DIAGNOSIS — Z510 Encounter for antineoplastic radiation therapy: Secondary | ICD-10-CM | POA: Diagnosis not present

## 2018-02-15 ENCOUNTER — Ambulatory Visit: Payer: Medicare Other

## 2018-02-15 ENCOUNTER — Encounter: Payer: Self-pay | Admitting: Radiation Oncology

## 2018-02-15 ENCOUNTER — Ambulatory Visit
Admission: RE | Admit: 2018-02-15 | Discharge: 2018-02-15 | Disposition: A | Discharge status: Home / Self Care | Payer: Medicare Other | Source: Ambulatory Visit | Attending: Radiation Oncology | Admitting: Radiation Oncology

## 2018-02-15 DIAGNOSIS — Z510 Encounter for antineoplastic radiation therapy: Secondary | ICD-10-CM | POA: Diagnosis not present

## 2018-02-18 ENCOUNTER — Ambulatory Visit: Payer: Medicare Other

## 2018-02-25 ENCOUNTER — Other Ambulatory Visit: Payer: Medicare Other

## 2018-02-25 ENCOUNTER — Ambulatory Visit: Payer: Medicare Other | Admitting: Oncology

## 2018-03-07 NOTE — Progress Notes (Signed)
  Radiation Oncology         (336) [phone] ________________________________  Name: Rebekah Wells MRN: [id number]  Date: 02/15/2018  DOB: [birth year]  End of Treatment Note  Diagnosis:   78 y.o. 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  Beams/energy:   3D / 6X, 10X Photon  Narrative: The patient tolerated radiation treatment relatively well.  She experienced increased fatigue and some skin irritation with erythema and hyperpigmentation. She is using Radiapex as directed. Moist desquamation was not present at the end of treatment.  Plan: The patient has completed radiation treatment. The patient will return to radiation oncology clinic for routine followup in one month. I advised them to call or return sooner if they have any questions or concerns related to their recovery or treatment.  ------------------------------------------------  Jodelle Gross, MD, PhD  This document serves as a record of services personally performed by Kyung Rudd, MD. It was created on his behalf by Rae Lips, a trained medical scribe. The creation of this record is based on the scribe's personal observations and the provider's statements to them. This document has been checked and approved by the attending provider.

## 2018-03-27 ENCOUNTER — Other Ambulatory Visit: Payer: Self-pay

## 2018-03-27 DIAGNOSIS — C50311 Malignant neoplasm of lower-inner quadrant of right female breast: Secondary | ICD-10-CM

## 2018-03-27 DIAGNOSIS — Z170 Estrogen receptor positive status [ER+]: Principal | ICD-10-CM

## 2018-03-27 NOTE — Progress Notes (Signed)
[facility]  [phone] [phone]     ID: Rebekah Wells DOB: 1940-03-24  MR#: [id number]  HCW#:[id number]  Patient Care Team: Lennox Grumbles, MD as PCP - General (Family Medicine) Stark Klein, MD as Consulting Physician (General Surgery) Minsa Weddington, Rebekah Dad, MD as Consulting Physician (Oncology) Kyung Rudd, MD as Consulting Physician (Radiation Oncology) Paralee Cancel, MD as Consulting Physician (Orthopedic Surgery) OTHER MD:   CHIEF COMPLAINT: Estrogen receptor positive breast cancer  CURRENT TREATMENT: Tamoxifen   HISTORY OF CURRENT ILLNESS: From the original intake note:  Rebekah Wells had routine screening mammography on 10/03/2017 showing a possible abnormality in the right breast. She underwent unilateral right diagnostic mammography with tomography and right breast ultrasonography at the [facility] in [place], [place] on 10/17/2017 showing: breast density category C. In the right breast lower inner quadrant at 5:30 o' clock and 2 cm from the nipple, there is a hypoechoic mass that measures 0.8 x 0.9 x 0.9 cm. Additionally, at the upper outer quadrant at 10 o'clock and 7 cm from the nipple there is an ill defined area with focal mixed echogenicity. Sonographically, there was one lymph node in the right axilla with a slightly thickened cortex.   Accordingly on 11/20/2017 she proceeded to biopsy of the right breast area in question. The pathology from this procedure showed ([id number]): At 10 o' clock, benign breast tissue with no malignancy identified.  Because there was significant fibrosis in the area, this may be discordant.  At the 5:30 lower inner quadrant, invasive ductal carcinoma grade II, with calcifications. Prognostic indicators significant for: estrogen receptor, 100% positive with strong staining intensity and progesterone receptor, 40% positive with moderate staining intensity. HER 2 is negative by FISH, with a signals ratio  of 1.29, and the average number per cell 1.9.  The patient's subsequent history is as detailed below.   INTERVAL HISTORY: Rebekah Wells returns today for follow-up and treatment of her estrogen receptor positive breast cancer. She is accompanied by her husband.  She continues on tamoxifen. She had a lot of hot flashes with diaphoresis in the beginning, but it has gotten better; they would wake her up at night on occasion. She has vaginal dryness with a burning sensation.   Since her last visit here, she completed adjuvant radiation lasting from 01/21/2018 02/15/2018  Site/dose: The right breast was initially treated to 42.56 Gy in 16 fractions, followed by an 8 Gy boost delivered in 4 fractions, to yield a final total dose of 50.56 Gy.   REVIEW OF SYSTEMS: Rebekah Wells thought that radiation went well. She had some fatigue, mostly during the last two weeks. Immediately after her last radiation, her stomach "felt awful" for about 5 days; she couldn't really eat or drink much during this time. She was afebrile. Her skin did well during and after radiation and had very minimal peeling. For exercise, she does silver sneakers 3 times per week. The patient denies unusual headaches, visual changes, nausea, vomiting, or dizziness. There has been no unusual cough, phlegm production, or pleurisy. This been no change in bowel or bladder habits. The patient denies unexplained fatigue or unexplained weight loss, bleeding, rash, or fever. A detailed review of systems was otherwise noncontributory.    PAST MEDICAL HISTORY: Past Medical History:  Diagnosis Date  . Arthritis    OA  . Asthma   . Cancer ([place])    right breast  . Dysrhythmia    tachycardia  . Gastritis   . GERD (gastroesophageal  reflux disease)   . H/O seasonal allergies   . Hyperlipemia     PAST SURGICAL HISTORY: Past Surgical History:  Procedure Laterality Date  . ABDOMINAL HYSTERECTOMY    . BREAST LUMPECTOMY WITH RADIOACTIVE SEED AND SENTINEL  LYMPH NODE BIOPSY Right 12/06/2017   Procedure: RIGHT BREAST LUMPECTOMY WITH RADIOACTIVE SEED AND SENTINEL LYMPH NODE BIOPSY;  Surgeon: Stark Klein, MD;  Location: [facility];  Service: General;  Laterality: Right;  . CARPAL TUNNEL RELEASE Left 07/09/2014   Procedure: LEFT CARPAL TUNNEL RELEASE;  Surgeon: Daryll Brod, MD;  Location: [facility];  Service: Orthopedics;  Laterality: Left;  . CESAREAN SECTION    . CHOLECYSTECTOMY    . DG TOES RT FOOT MULTIPLE SPECIF ([place] HX) Right   . DIAGNOSTIC LAPAROSCOPY    . DIGIT NAIL REMOVAL Right 12/05/2016   Procedure: REMOVAL OF SCREW RIGHT GREAT TOE;  Surgeon: Paralee Cancel, MD;  Location: [facility];  Service: Orthopedics;  Laterality: Right;  . EYE SURGERY     bilateral cataract with lens implants  . HAND ARTHROPLASTY Right    for OA  . KNEE ARTHROSCOPY     x3  . NISSEN FUNDOPLICATION    . TONSILLECTOMY    . TOTAL KNEE ARTHROPLASTY Right 12/05/2016   Procedure: RIGHT TOTAL KNEE ARTHROPLASTY;  Surgeon: Paralee Cancel, MD;  Location: [facility];  Service: Orthopedics;  Laterality: Right;  90 mins  . TOTAL KNEE ARTHROPLASTY Left 03/20/2017   Procedure: LEFT TOTAL KNEE ARTHROPLASTY;  Surgeon: Paralee Cancel, MD;  Location: [facility];  Service: Orthopedics;  Laterality: Left;  70 mins  Appendectomy   FAMILY HISTORY Family History  Problem Relation Age of Onset  . Colon cancer Mother   . CAD Father 60  . Lung cancer Sister    The patient's father died at age 20 of CAD. The patient's mother died at age 15 due to colon cancer. The patient had 2 brothers and 2 sisters.  1 of the patient's sisters died of lung cancer ( heavy smoker). The patient's other sister is alive with CLL. There was a maternal grandfather with colon cancer. There was a maternal grandmother with sarcoma of the leg. The patient denies a family history of breast or ovarian cancer.    GYNECOLOGIC HISTORY:  No LMP recorded. Patient has had a hysterectomy. Menarche: 78 years old Age  at first live birth: 78 years old She is GXP4.  She is status post patial hysterectomy without BSO. Her LMP was around age 25. She used HRT for 10 years. She used oral contraception for 1 year in 1969.    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 Her husband, [name] was a Personal assistant. The patient's daughter [name] is a respiratory therapist in [place], [place]. The patient's son, Virgel Bouquet lives in [place], [place] and works in Risk analyst and event planning. The patient's daughter, Junita Push lives in [place], [place] and is a Theatre manager. The patient's daughter Dr. Sheryn Bison is a rheumatologist in [place].    ADVANCED DIRECTIVES:    HEALTH MAINTENANCE: Social History   Tobacco Use  . Smoking status: Never Smoker  . Smokeless tobacco: Never Used  Substance Use Topics  . Alcohol use: No  . Drug use: No     Colonoscopy: in [place] over 7 years ago  PAP: remote  Bone density: 12/05/2017 T score -2.5   Allergies  Allergen Reactions  . Penicillins Rash and Other (See Comments)    PATIENT HAS HAD A PCN  REACTION WITH IMMEDIATE RASH, FACIAL/TONGUE/THROAT SWELLING, SOB, OR LIGHTHEADEDNESS WITH HYPOTENSION:  #  #  YES  #  #  HAS PT DEVELOPED SEVERE RASH INVOLVING MUCUS MEMBRANES or SKIN NECROSIS: #  #  YES  #  #  Has patient had a PCN reaction that required hospitalization: ALREADY HOSPITALIZED Has patient had a PCN reaction occurring within the last 10 years: No   . Other Rash    Steri Strips/Surgi glue (Dermatitis) Patient developed contact dermatitis with dermabond following knee surgery and, possibly, with steri strips following her breast core biopsy  . Silver Rash    Likely due to Sulfa component  . Sulfa Antibiotics Rash    Current Outpatient Medications  Medication Sig Dispense Refill  . albuterol (PROVENTIL HFA;VENTOLIN HFA) 108 (90 BASE) MCG/ACT inhaler Inhale 1-2 puffs into the lungs every 6 (six) hours as needed for wheezing or shortness of breath.     . beta  carotene w/minerals (OCUVITE) tablet Take 1 tablet by mouth 4 (four) times a week. At night.    . Calcium Carb-Cholecalciferol (CALCIUM 600+D3 PO) Take 1 tablet by mouth at bedtime.    . cetirizine (ZYRTEC) 10 MG tablet Take 10 mg by mouth at bedtime as needed for allergies.     Marland Kitchen estradiol (ESTRING) 2 MG vaginal ring Place 2 mg vaginally every 3 (three) months. follow package directions 1 each 12  . meloxicam (MOBIC) 15 MG tablet Take 15 mg by mouth daily as needed for pain.    . metoprolol succinate (TOPROL-XL) 25 MG 24 hr tablet Take 25 mg by mouth at bedtime.     Marland Kitchen omeprazole (PRILOSEC) 20 MG capsule Take 20 mg by mouth daily as needed (for acid reflex).     . simvastatin (ZOCOR) 40 MG tablet Take 40 mg by mouth every evening.     . tamoxifen (NOLVADEX) 20 MG tablet Take 1 tablet (20 mg total) by mouth daily for 30 days. 90 tablet 12  . traZODone (DESYREL) 50 MG tablet Take 25 mg by mouth at bedtime as needed for sleep.     No current facility-administered medications for this visit.     OBJECTIVE: Older white woman in no acute distress  Vitals:   03/28/18 1337  BP: 129/62  Pulse: 100  Resp: 18  Temp: 98.3 F (36.8 C)  SpO2: 97%     Body mass index is 26.18 kg/m.   Wt Readings from Last 3 Encounters:  03/28/18 147 lb 12.8 oz (67 kg)  01/08/18 149 lb 9.6 oz (67.9 kg)  12/21/17 151 lb 3.2 oz (68.6 kg)      ECOG FS:1 - Symptomatic but completely ambulatory  Sclerae unicteric, pupils round and equal No cervical or supraclavicular adenopathy Lungs no rales or rhonchi Heart regular rate and rhythm Abd soft, nontender, positive bowel sounds MSK no focal spinal tenderness, no upper extremity lymphedema Neuro: nonfocal, well oriented, appropriate affect Breasts: The right breast is status post lumpectomy and radiation.  The cosmetic result is good.  There is mild hyperpigmentation.  There is no evidence of residual or recurrent disease.  Both axillae are benign.   LAB  RESULTS:  CMP     Component Value Date/Time   NA 141 03/28/2018 1323   K 3.7 03/28/2018 1323   CL 105 03/28/2018 1323   CO2 27 03/28/2018 1323   GLUCOSE 148 (H) 03/28/2018 1323   BUN 14 03/28/2018 1323   CREATININE 0.77 03/28/2018 1323   CALCIUM 9.0 03/28/2018 1323  PROT 6.4 (L) 03/28/2018 1323   ALBUMIN 3.6 03/28/2018 1323   AST 48 (H) 03/28/2018 1323   ALT 26 03/28/2018 1323   ALKPHOS 55 03/28/2018 1323   BILITOT 0.5 03/28/2018 1323   GFRNONAA >60 03/28/2018 1323   GFRAA >60 03/28/2018 1323    No results found for: TOTALPROTELP, ALBUMINELP, A1GS, A2GS, BETS, BETA2SER, GAMS, MSPIKE, SPEI  No results found for: KPAFRELGTCHN, LAMBDASER, KAPLAMBRATIO  Lab Results  Component Value Date   WBC 6.9 12/05/2017   NEUTROABS 3.7 11/21/2017   HGB 12.8 12/05/2017   HCT 43.2 12/05/2017   MCV 91.7 12/05/2017   PLT 259 12/05/2017    _0 @  No results found for: LABCA2  No components found for: [id number]  No results for input(s): INR in the last 168 hours.  No results found for: LABCA2  No results found for: HYI502  No results found for: DXA128  No results found for: NOM767  No results found for: CA2729  No components found for: HGQUANT  No results found for: CEA1 / No results found for: CEA1   No results found for: AFPTUMOR  No results found for: CHROMOGRNA  No results found for: PSA1  Appointment on 03/28/2018  Component Date Value Ref Range Status  . Sodium 03/28/2018 141  135 - 145 mmol/L Final  . Potassium 03/28/2018 3.7  3.5 - 5.1 mmol/L Final  . Chloride 03/28/2018 105  98 - 111 mmol/L Final  . CO2 03/28/2018 27  22 - 32 mmol/L Final  . Glucose, Bld 03/28/2018 148* 70 - 99 mg/dL Final  . BUN 03/28/2018 14  8 - 23 mg/dL Final  . Creatinine 03/28/2018 0.77  0.44 - 1.00 mg/dL Final  . Calcium 03/28/2018 9.0  8.9 - 10.3 mg/dL Final  . Total Protein 03/28/2018 6.4* 6.5 - 8.1 g/dL Final  . Albumin 03/28/2018 3.6  3.5 - 5.0 g/dL Final  . AST  03/28/2018 48* 15 - 41 U/L Final  . ALT 03/28/2018 26  0 - 44 U/L Final  . Alkaline Phosphatase 03/28/2018 55  38 - 126 U/L Final  . Total Bilirubin 03/28/2018 0.5  0.3 - 1.2 mg/dL Final  . GFR, Est Non Af Am 03/28/2018 >60  >60 mL/min Final  . GFR, Est AFR Am 03/28/2018 >60  >60 mL/min Final  . Anion gap 03/28/2018 9  5 - 15 Final   Performed at [facility], [place] [address] [place], [place] [address]    (this displays the last labs from the last 3 days)  No results found for: TOTALPROTELP, ALBUMINELP, A1GS, A2GS, BETS, BETA2SER, GAMS, MSPIKE, SPEI (this displays SPEP labs)  No results found for: KPAFRELGTCHN, LAMBDASER, KAPLAMBRATIO (kappa/lambda light chains)  No results found for: HGBA, HGBA2QUANT, HGBFQUANT, HGBSQUAN (Hemoglobinopathy evaluation)   No results found for: LDH  No results found for: IRON, TIBC, IRONPCTSAT (Iron and TIBC)  No results found for: FERRITIN  Urinalysis No results found for: COLORURINE, APPEARANCEUR, LABSPEC, PHURINE, GLUCOSEU, HGBUR, BILIRUBINUR, KETONESUR, PROTEINUR, UROBILINOGEN, NITRITE, LEUKOCYTESUR   STUDIES: No results found.   ELIGIBLE FOR AVAILABLE RESEARCH PROTOCOL: no   ASSESSMENT: 78 y.o. [place], [place] woman status post right breast biopsy x2 on 10/24/2017 for a clinical T1b NX, stage IA invasive ductal carcinoma, grade 2, estrogen and progesterone receptor positive, HER-2 not amplified  (a) biopsy of a second site in the same breast showed fibrosis, discordant  (1) status post right lumpectomy and sentinel lymph node sampling 12/06/2017 for a pT1c pN0(i), stage IA invasive  ductal carcinoma, with negative margins.  (a) a total of 5 axillary lymph nodes were removed  (2) Oncotype score of 19 predicts a risk of recurrence outside the breast in the next 9 years of 6% if her only systemic therapy is an antiestrogen for 5 years; it also predicts no significant benefit from chemotherapy.  (4) adjuvant radiation  completed 01/21/2018 - 02/15/2018  Site/dose: The right breast was initially treated to 42.56 Gy in 16 fractions, followed by an 8 Gy boost delivered in 4 fractions, to yield a final total dose of 50.56 Gy.  (5) tamoxifen started 12/21/2017  (a) status post prior hysterectomy without salpingo-oophorectomy   PLAN: Rebekah Wells has completed her local treatment and in general did very well with it.  In fact thinking back on it the worst moment for her was when she had the seeds placed.  That was painful.  Otherwise her treatments have been remarkably well-tolerated.  She is doing equally well on the tamoxifen.  She has occasional hot flashes but no other symptoms related to the drug, which we plan to continue for 5 years.  She is having problems with vaginal dryness.  She used to use vaginal estrogens and while on tamoxifen but is very safe.  I think she would benefit from participation in the pelvic rehab program and I have placed that referral as well.  We discussed Estring and she is interested.  I went ahead and wrote her a prescription after discussing the possible toxicity side effects and complications  Otherwise she will have mammography again in August she will return to see me shortly after that to discuss results.  If all goes well at that point we will start seeing her once a year thereafter  She knows to call for any other issues that may develop before the next visit.   Semisi Biela, Rebekah Dad, MD  03/28/18 2:08 PM Medical Oncology and Hematology [facility] [address] [place], [place] [address] Tel. [phone]    Fax. [phone]  I, Jacqualyn Posey am acting as a Education administrator for Chauncey Cruel, MD.   I, Lurline Del MD, have reviewed the above documentation for accuracy and completeness, and I agree with the above.

## 2018-03-28 ENCOUNTER — Telehealth: Payer: Self-pay | Admitting: Oncology

## 2018-03-28 ENCOUNTER — Inpatient Hospital Stay: Payer: Medicare Other | Attending: Oncology

## 2018-03-28 ENCOUNTER — Inpatient Hospital Stay (HOSPITAL_BASED_OUTPATIENT_CLINIC_OR_DEPARTMENT_OTHER): Payer: Medicare Other | Admitting: Oncology

## 2018-03-28 VITALS — BP 129/62 | HR 100 | Temp 98.3°F | Resp 18 | Ht 63.0 in | Wt 147.8 lb

## 2018-03-28 DIAGNOSIS — C50311 Malignant neoplasm of lower-inner quadrant of right female breast: Secondary | ICD-10-CM

## 2018-03-28 DIAGNOSIS — Z791 Long term (current) use of non-steroidal anti-inflammatories (NSAID): Secondary | ICD-10-CM | POA: Insufficient documentation

## 2018-03-28 DIAGNOSIS — Z170 Estrogen receptor positive status [ER+]: Secondary | ICD-10-CM | POA: Diagnosis not present

## 2018-03-28 DIAGNOSIS — C50811 Malignant neoplasm of overlapping sites of right female breast: Secondary | ICD-10-CM | POA: Insufficient documentation

## 2018-03-28 DIAGNOSIS — Z90710 Acquired absence of both cervix and uterus: Secondary | ICD-10-CM | POA: Insufficient documentation

## 2018-03-28 DIAGNOSIS — Z79899 Other long term (current) drug therapy: Secondary | ICD-10-CM | POA: Insufficient documentation

## 2018-03-28 DIAGNOSIS — Z79810 Long term (current) use of selective estrogen receptor modulators (SERMs): Secondary | ICD-10-CM | POA: Insufficient documentation

## 2018-03-28 DIAGNOSIS — N898 Other specified noninflammatory disorders of vagina: Secondary | ICD-10-CM

## 2018-03-28 DIAGNOSIS — Z923 Personal history of irradiation: Secondary | ICD-10-CM | POA: Insufficient documentation

## 2018-03-28 DIAGNOSIS — R232 Flushing: Secondary | ICD-10-CM | POA: Diagnosis not present

## 2018-03-28 LAB — CBC WITH DIFFERENTIAL (CANCER CENTER ONLY)
Abs Immature Granulocytes: 0.02 10*3/uL (ref 0.00–0.07)
BASOS ABS: 0.1 10*3/uL (ref 0.0–0.1)
BASOS PCT: 1 %
EOS ABS: 0.2 10*3/uL (ref 0.0–0.5)
EOS PCT: 3 %
HEMATOCRIT: 36 % (ref 36.0–46.0)
Hemoglobin: 10.9 g/dL — ABNORMAL LOW (ref 12.0–15.0)
Immature Granulocytes: 0 %
LYMPHS PCT: 30 %
Lymphs Abs: 1.5 10*3/uL (ref 0.7–4.0)
MCH: 27.5 pg (ref 26.0–34.0)
MCHC: 30.3 g/dL (ref 30.0–36.0)
MCV: 90.9 fL (ref 80.0–100.0)
Monocytes Absolute: 0.5 10*3/uL (ref 0.1–1.0)
Monocytes Relative: 9 %
NRBC: 0 % (ref 0.0–0.2)
Neutro Abs: 2.8 10*3/uL (ref 1.7–7.7)
Neutrophils Relative %: 57 %
Platelet Count: 218 10*3/uL (ref 150–400)
RBC: 3.96 MIL/uL (ref 3.87–5.11)
RDW: 11.9 % (ref 11.5–15.5)
WBC: 4.9 10*3/uL (ref 4.0–10.5)

## 2018-03-28 LAB — CMP (CANCER CENTER ONLY)
ALT: 26 U/L (ref 0–44)
AST: 48 U/L — ABNORMAL HIGH (ref 15–41)
Albumin: 3.6 g/dL (ref 3.5–5.0)
Alkaline Phosphatase: 55 U/L (ref 38–126)
Anion gap: 9 (ref 5–15)
BILIRUBIN TOTAL: 0.5 mg/dL (ref 0.3–1.2)
BUN: 14 mg/dL (ref 8–23)
CALCIUM: 9 mg/dL (ref 8.9–10.3)
CO2: 27 mmol/L (ref 22–32)
Chloride: 105 mmol/L (ref 98–111)
Creatinine: 0.77 mg/dL (ref 0.44–1.00)
Glucose, Bld: 148 mg/dL — ABNORMAL HIGH (ref 70–99)
Potassium: 3.7 mmol/L (ref 3.5–5.1)
Sodium: 141 mmol/L (ref 135–145)
TOTAL PROTEIN: 6.4 g/dL — AB (ref 6.5–8.1)

## 2018-03-28 MED ORDER — TAMOXIFEN CITRATE 20 MG PO TABS: 20.0000 mg | ORAL_TABLET | Freq: Every day | ORAL | 12 refills | Status: AC

## 2018-03-28 MED ORDER — ESTRADIOL 2 MG VA RING: 2.0000 mg | VAGINAL_RING | VAGINAL | 12 refills | Status: DC

## 2018-03-28 NOTE — Telephone Encounter (Signed)
Gave avs and calendar ° °

## 2018-04-01 ENCOUNTER — Telehealth: Payer: Self-pay | Admitting: Radiation Oncology

## 2018-04-01 ENCOUNTER — Ambulatory Visit: Payer: Medicare Other | Attending: Radiation Oncology | Admitting: Radiation Oncology

## 2018-04-01 NOTE — Telephone Encounter (Signed)
I called the patient to see how she was doing. She had cancelled her 1 month follow up appointment with me a few weeks ago but still somehow had an appt to see me today. I suspect she did not know about today's appointment, so when I called to check on her I had to leave a message. I encouraged her to phone me back to discuss how she's doing.

## 2018-06-10 ENCOUNTER — Telehealth: Payer: Self-pay | Admitting: Adult Health

## 2018-06-10 NOTE — Telephone Encounter (Signed)
Rescheduled SCP and changed to Webex appt per sch msg. Patient will be contacted

## 2018-07-02 ENCOUNTER — Telehealth: Payer: Self-pay | Admitting: Adult Health

## 2018-07-02 NOTE — Telephone Encounter (Signed)
Seen note in Appt desk where the pt would prefer a phone visit instead of office visit or Webex mtg for 07/03/18

## 2018-07-03 ENCOUNTER — Inpatient Hospital Stay: Payer: Medicare Other | Attending: Oncology | Admitting: Adult Health

## 2018-07-03 ENCOUNTER — Encounter: Payer: Self-pay | Admitting: Adult Health

## 2018-07-03 DIAGNOSIS — Z170 Estrogen receptor positive status [ER+]: Secondary | ICD-10-CM

## 2018-07-03 DIAGNOSIS — C50311 Malignant neoplasm of lower-inner quadrant of right female breast: Secondary | ICD-10-CM | POA: Diagnosis not present

## 2018-07-03 NOTE — Progress Notes (Signed)
SURVIVORSHIP VIRTUAL VISIT:  I connected with Rebekah Wells on 07/03/18 at 11:00 AM EDT by teleophone and verified that I am speaking with the correct person using two identifiers.   I discussed the limitations, risks, security and privacy concerns of performing an evaluation and management service by telephone and the availability of in person appointments. I also discussed with the patient that there may be a patient responsible charge related to this service. The patient expressed understanding and agreed to proceed.   BRIEF ONCOLOGIC HISTORY:    Malignant neoplasm of lower-inner quadrant of right breast of female, estrogen receptor positive ([place])   10/24/2017 Initial Diagnosis    status post right breast biopsy x2 on 10/24/2017 for a clinical T1b NX, stage IA invasive ductal carcinoma, grade 2, estrogen and progesterone receptor positive, HER-2 not amplified             (a) biopsy of a second site in the same breast showed fibrosis, discordant    12/06/2017 Surgery    status post right lumpectomy and sentinel lymph node sampling for a pT1c pN0, stage IA invasive ductal carcinoma, with negative margins.             (a) a total of 5 axillary lymph nodes were removed    12/06/2017 Oncotype testing    Oncotype score of 19 predicts a risk of recurrence outside the breast in the next 9 years of 6% if her only systemic therapy is an antiestrogen for 5 years; it also predicts no significant benefit from chemotherapy.    01/02/2018 Cancer Staging    Staging form: Breast, AJCC 8th Edition - Pathologic: Stage IA (pT1c, pN0, cM0, G2, ER+, PR+, HER2-, Oncotype DX score: 19) - Signed by Gardenia Phlegm, NP on 01/02/2018    12/2017 -  Anti-estrogen oral therapy     tamoxifen started 12/21/2017             (a) status post prior hysterectomy without salpingo-oophorectomy    01/21/2018 - 02/15/2018 Radiation Therapy    Site/dose:The right breast was initially treated to 42.56 Gy in 16 fractions,  followed by an 8 Gy boost delivered in 4 fractions, to yield a final total dose of 50.56 Gy.      INTERVAL HISTORY:  Rebekah Wells to review her survivorship care plan detailing her treatment course for breast cancer, as well as monitoring long-term side effects of that treatment, education regarding health maintenance, screening, and overall wellness and health promotion.     Overall, Rebekah Wells reports feeling quite well.  She is taking Tamoxifen daily.  She says her only issue is that her surgical breast is warm and has remained slightly pink.  She says the breast is only slightly warmer.  She says that the breast is not tender.  She thinks it may be slightly larger.  She says she tried to call a month ago and couldn't get through, so she waited until our appointment.  She notes it isn't worsening and is slightly improving.    Rebekah Wells has intermittent hot flashes that are tolerable.  She does occasionally have some burning in her vaginal area, with mild discharge.  She denies any foul smelling discharge.  She notes that she is doing Kegels and using cream and notes that it is improved with these interventions.  Rebekah Wells has osteoarthritis and notes perhaps a mild increase in these, but they are bearable.  She is walking to remain active, and notes that the [employer] is closed so she  is not exercising as much.    REVIEW OF SYSTEMS:  Review of Systems  Constitutional: Negative for appetite change, chills, fatigue, fever and unexpected weight change.  HENT:   Negative for hearing loss, lump/mass and trouble swallowing.   Eyes: Negative for eye problems and icterus.  Respiratory: Negative for chest tightness, cough and shortness of breath.   Cardiovascular: Negative for chest pain, leg swelling and palpitations.  Gastrointestinal: Negative for abdominal distention, abdominal pain, constipation, diarrhea, nausea and vomiting.  Endocrine: Positive for hot flashes.  Genitourinary: Positive for frequency (has h/o  overactive bladder).   Musculoskeletal: Positive for arthralgias (h/o osteoarthritis).  Skin: Negative for itching and rash.  Neurological: Negative for dizziness, extremity weakness, headaches and numbness.  Hematological: Negative for adenopathy. Does not bruise/bleed easily.  Psychiatric/Behavioral: Negative for depression. The patient is not nervous/anxious.      ONCOLOGY TREATMENT TEAM:  1. Surgeon:  Dr. Barry Dienes at [facility] 2. Medical Oncologist: Dr. Jana Hakim 3. Radiation Oncologist: Dr. Lisbeth Renshaw    PAST MEDICAL/SURGICAL HISTORY:  Past Medical History:  Diagnosis Date  . Arthritis    OA  . Asthma   . Cancer ([place])    right breast  . Dysrhythmia    tachycardia  . Gastritis   . GERD (gastroesophageal reflux disease)   . H/O seasonal allergies   . Hyperlipemia    Past Surgical History:  Procedure Laterality Date  . ABDOMINAL HYSTERECTOMY    . BREAST LUMPECTOMY WITH RADIOACTIVE SEED AND SENTINEL LYMPH NODE BIOPSY Right 12/06/2017   Procedure: RIGHT BREAST LUMPECTOMY WITH RADIOACTIVE SEED AND SENTINEL LYMPH NODE BIOPSY;  Surgeon: Stark Klein, MD;  Location: [place];  Service: General;  Laterality: Right;  . CARPAL TUNNEL RELEASE Left 07/09/2014   Procedure: LEFT CARPAL TUNNEL RELEASE;  Surgeon: Daryll Brod, MD;  Location: [place];  Service: Orthopedics;  Laterality: Left;  . CESAREAN SECTION    . CHOLECYSTECTOMY    . DG TOES RT FOOT MULTIPLE SPECIF ([place] HX) Right   . DIAGNOSTIC LAPAROSCOPY    . DIGIT NAIL REMOVAL Right 12/05/2016   Procedure: REMOVAL OF SCREW RIGHT GREAT TOE;  Surgeon: Paralee Cancel, MD;  Location: [facility];  Service: Orthopedics;  Laterality: Right;  . EYE SURGERY     bilateral cataract with lens implants  . HAND ARTHROPLASTY Right    for OA  . KNEE ARTHROSCOPY     x3  . NISSEN FUNDOPLICATION    . TONSILLECTOMY    . TOTAL KNEE ARTHROPLASTY Right 12/05/2016   Procedure: RIGHT TOTAL KNEE ARTHROPLASTY;  Surgeon: Paralee Cancel, MD;  Location: [facility];  Service: Orthopedics;  Laterality: Right;  90 mins  . TOTAL KNEE ARTHROPLASTY Left 03/20/2017   Procedure: LEFT TOTAL KNEE ARTHROPLASTY;  Surgeon: Paralee Cancel, MD;  Location: [facility];  Service: Orthopedics;  Laterality: Left;  70 mins     ALLERGIES:  Allergies  Allergen Reactions  . Penicillins Rash and Other (See Comments)    PATIENT HAS HAD A PCN REACTION WITH IMMEDIATE RASH, FACIAL/TONGUE/THROAT SWELLING, SOB, OR LIGHTHEADEDNESS WITH HYPOTENSION:  #  #  YES  #  #  HAS PT DEVELOPED SEVERE RASH INVOLVING MUCUS MEMBRANES or SKIN NECROSIS: #  #  YES  #  #  Has patient had a PCN reaction that required hospitalization: ALREADY HOSPITALIZED Has patient had a PCN reaction occurring within the last 10 years: No   . Other Rash    Steri Strips/Surgi glue (Dermatitis) Patient developed contact dermatitis with  dermabond following knee surgery and, possibly, with steri strips following her breast core biopsy  . Silver Rash    Likely due to Sulfa component  . Sulfa Antibiotics Rash     CURRENT MEDICATIONS:  Outpatient Encounter Medications as of 07/03/2018  Medication Sig  . albuterol (PROVENTIL HFA;VENTOLIN HFA) 108 (90 BASE) MCG/ACT inhaler Inhale 1-2 puffs into the lungs every 6 (six) hours as needed for wheezing or shortness of breath.   . beta carotene w/minerals (OCUVITE) tablet Take 1 tablet by mouth 4 (four) times a week. At night.  . Calcium Carb-Cholecalciferol (CALCIUM 600+D3 PO) Take 1 tablet by mouth at bedtime.  . cetirizine (ZYRTEC) 10 MG tablet Take 10 mg by mouth at bedtime as needed for allergies.   Marland Kitchen estradiol (ESTRING) 2 MG vaginal ring Place 2 mg vaginally every 3 (three) months. follow package directions  . meloxicam (MOBIC) 15 MG tablet Take 15 mg by mouth daily as needed for pain.  . metoprolol succinate (TOPROL-XL) 25 MG 24 hr tablet Take 25 mg by mouth at bedtime.   Marland Kitchen omeprazole (PRILOSEC) 20 MG capsule Take 20 mg by mouth daily as  needed (for acid reflex).   . simvastatin (ZOCOR) 40 MG tablet Take 40 mg by mouth every evening.   . traZODone (DESYREL) 50 MG tablet Take 25 mg by mouth at bedtime as needed for sleep.   No facility-administered encounter medications on file as of 07/03/2018.      ONCOLOGIC FAMILY HISTORY:  Family History  Problem Relation Age of Onset  . Colon cancer Mother   . CAD Father 12  . Lung cancer Sister      GENETIC COUNSELING/TESTING: Not at this time  SOCIAL HISTORY:  Social History   Socioeconomic History  . Marital status: Married    Spouse name: Not on file  . Number of children: 4  . Years of education: Not on file  . Highest education level: Not on file  Occupational History  . Not on file  Social Needs  . Financial resource strain: Not on file  . Food insecurity:    Worry: Not on file    Inability: Not on file  . Transportation needs:    Medical: Not on file    Non-medical: Not on file  Tobacco Use  . Smoking status: Never Smoker  . Smokeless tobacco: Never Used  Substance and Sexual Activity  . Alcohol use: No  . Drug use: No  . Sexual activity: Not on file  Lifestyle  . Physical activity:    Days per week: Not on file    Minutes per session: Not on file  . Stress: Not on file  Relationships  . Social connections:    Talks on phone: Not on file    Gets together: Not on file    Attends religious service: Not on file    Active member of club or organization: Not on file    Attends meetings of clubs or organizations: Not on file    Relationship status: Not on file  . Intimate partner violence:    Fear of current or ex partner: Not on file    Emotionally abused: Not on file    Physically abused: Not on file    Forced sexual activity: Not on file  Other Topics Concern  . Not on file  Social History Narrative   Lives at home with husband.  13 grand children.      OBSERVATIONS/OBJECTIVE:  Patient sounds well.  No  apparent distress.  Speech non  pressured, breathing non labored, mood and behavior are normal.    LABORATORY DATA:  None for this visit.  DIAGNOSTIC IMAGING:  None for this visit.      ASSESSMENT AND PLAN:  Rebekah Wells is a pleasant 78 y.o. female with Stage IA right breast invasive ductal carcinoma, ER+/PR+/HER2-, diagnosed in 10/2017, treated with lumpectomy, adjuvant radiation therapy, and anti-estrogen therapy with Tamoxifen beginning in 12/2017.  She presents to the Survivorship Clinic for our initial meeting and routine follow-up post-completion of treatment for breast cancer.    1. Stage IA right breast cancer:  Rebekah Wells is continuing to recover from definitive treatment for breast cancer. She will follow-up with her medical oncologist, Dr. Jana Hakim in 09/2018 with history and physical exam per surveillance protocol.  She will continue her anti-estrogen therapy with Tamoxifen. Thus far, she is tolerating the Tamoxifen well, with minimal side effects.  Her mammogram is due 09/2018; orders placed today.  Her breast density is category C.  We reviewed what breast density means today in detail.    Today, a comprehensive survivorship care plan and treatment summary was reviewed with the patient today detailing her breast cancer diagnosis, treatment course, potential late/long-term effects of treatment, appropriate follow-up care with recommendations for the future, and patient education resources.  A copy of this summary, along with a letter will be sent to the patient's primary care provider via mail/fax/In Basket message after today's visit.    2. 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  I recommended she massage her breast with vitamin E oil and consider wearing compression bras.  She notes she isn't concerned about it and has no other associated or alarming  symptoms so she plans on massaging it.  I let her know if it worsens in any way that she will need to come in for [place] to evaluate.  (3 hour drive for patient)  3. Bone health:  Given Rebekah Wells's age/history of breast cancer, she is at risk for bone demineralization.  Her last DEXA scan was 11/2017, which showed osteoporosis with a T score of -2.5 in the left femur.  I counseled her that Tamoxifen has a protective effect on her bones.  In the meantime, she was encouraged to increase her consumption of foods rich in calcium, as well as increase her weight-bearing activities.  She is seeing a rheumatologist about her bone density and will have this testing repeated in 11/2019 to determine whether or not bisphosphanate therapy is recommended.  She was given education on specific activities to promote bone health.  4. Cancer screening:  Due to Rebekah Wells's history and her age, she should receive screening for skin cancers, colon cancer, and gynecologic cancers.  The information and recommendations are listed on the patient's comprehensive care plan/treatment summary and were reviewed in detail with the patient.    5. Health maintenance and wellness promotion: Rebekah Wells was encouraged to consume 5-7 servings of fruits and vegetables per day. We reviewed the "Nutrition Rainbow" handout.  She was also encouraged to engage in moderate to vigorous exercise for 30 minutes per day most days of the week. We discussed the [employer] fitness program, which is designed for cancer survivors to help them become more physically fit after cancer treatments.  She was instructed to limit  her alcohol consumption and continue to abstain from tobacco use.     6. Support services/counseling: It is not uncommon for this period of the patient's cancer care trajectory to be one of many emotions and stressors.  We discussed how this can be increasingly difficult during the times of quarantine and social distancing due to the  COVID-19 pandemic.   She was given information regarding our available services and encouraged to contact me with any questions or for help enrolling in any of our support group/programs.    Follow up instructions:    -Return to cancer center in 09/2018  -Mammogram due in 09/2018 -She is welcome to return back to the Survivorship Clinic at any time; no additional follow-up needed at this time.  -Consider referral back to survivorship as a long-term survivor for continued surveillance  The patient was provided an opportunity to ask questions and all were answered. The patient agreed with the plan and demonstrated an understanding of the instructions.   The patient was advised to call back or seek an in-person evaluation if the symptoms worsen or if the condition fails to improve as anticipated.   I provided 25 minutes of non face-to-face telephone visit time during this encounter, and > 50% was spent counseling as documented under my assessment & plan.  Scot Dock, NP

## 2018-07-04 ENCOUNTER — Encounter: Payer: Medicare Other | Admitting: Adult Health

## 2018-07-04 ENCOUNTER — Telehealth: Payer: Self-pay | Admitting: Oncology

## 2018-07-04 NOTE — Telephone Encounter (Signed)
Tried to reach regarding schedule °

## 2018-10-17 ENCOUNTER — Other Ambulatory Visit: Payer: Self-pay

## 2018-10-17 ENCOUNTER — Other Ambulatory Visit: Payer: Medicare Other

## 2018-10-17 ENCOUNTER — Ambulatory Visit: Payer: Medicare Other | Admitting: Oncology

## 2018-10-17 ENCOUNTER — Other Ambulatory Visit: Payer: Self-pay | Admitting: *Deleted

## 2018-10-17 ENCOUNTER — Inpatient Hospital Stay: Payer: Medicare Other | Attending: Oncology | Admitting: Oncology

## 2018-10-17 ENCOUNTER — Inpatient Hospital Stay: Payer: Medicare Other

## 2018-10-17 VITALS — BP 131/69 | HR 102 | Temp 98.7°F | Resp 18 | Wt 145.0 lb

## 2018-10-17 DIAGNOSIS — Z170 Estrogen receptor positive status [ER+]: Secondary | ICD-10-CM

## 2018-10-17 DIAGNOSIS — E785 Hyperlipidemia, unspecified: Secondary | ICD-10-CM | POA: Diagnosis not present

## 2018-10-17 DIAGNOSIS — Z90710 Acquired absence of both cervix and uterus: Secondary | ICD-10-CM | POA: Diagnosis not present

## 2018-10-17 DIAGNOSIS — C50311 Malignant neoplasm of lower-inner quadrant of right female breast: Secondary | ICD-10-CM | POA: Diagnosis present

## 2018-10-17 DIAGNOSIS — Z923 Personal history of irradiation: Secondary | ICD-10-CM | POA: Diagnosis not present

## 2018-10-17 DIAGNOSIS — J45909 Unspecified asthma, uncomplicated: Secondary | ICD-10-CM | POA: Diagnosis not present

## 2018-10-17 DIAGNOSIS — Z79810 Long term (current) use of selective estrogen receptor modulators (SERMs): Secondary | ICD-10-CM | POA: Diagnosis not present

## 2018-10-17 DIAGNOSIS — Z79899 Other long term (current) drug therapy: Secondary | ICD-10-CM | POA: Diagnosis not present

## 2018-10-17 DIAGNOSIS — Z791 Long term (current) use of non-steroidal anti-inflammatories (NSAID): Secondary | ICD-10-CM | POA: Diagnosis not present

## 2018-10-17 LAB — CMP (CANCER CENTER ONLY)
ALT: 27 U/L (ref 0–44)
AST: 44 U/L — ABNORMAL HIGH (ref 15–41)
Albumin: 3.6 g/dL (ref 3.5–5.0)
Alkaline Phosphatase: 62 U/L (ref 38–126)
Anion gap: 9 (ref 5–15)
BUN: 16 mg/dL (ref 8–23)
CO2: 23 mmol/L (ref 22–32)
Calcium: 8.5 mg/dL — ABNORMAL LOW (ref 8.9–10.3)
Chloride: 107 mmol/L (ref 98–111)
Creatinine: 0.89 mg/dL (ref 0.44–1.00)
GFR, Est AFR Am: >60 mL/min (ref >60)
GFR, Estimated: >60 mL/min (ref >60)
Glucose, Bld: 179 mg/dL — ABNORMAL HIGH (ref 70–99)
Potassium: 4 mmol/L (ref 3.5–5.1)
Sodium: 139 mmol/L (ref 135–145)
Total Bilirubin: 0.2 mg/dL — ABNORMAL LOW (ref 0.3–1.2)
Total Protein: 6.6 g/dL (ref 6.5–8.1)

## 2018-10-17 LAB — CBC WITH DIFFERENTIAL (CANCER CENTER ONLY)
Abs Immature Granulocytes: 0.02 10*3/uL (ref 0.00–0.07)
Basophils Absolute: 0.1 10*3/uL (ref 0.0–0.1)
Basophils Relative: 1 %
Eosinophils Absolute: 0.2 10*3/uL (ref 0.0–0.5)
Eosinophils Relative: 4 %
HCT: 38.8 % (ref 36.0–46.0)
Hemoglobin: 12 g/dL (ref 12.0–15.0)
Immature Granulocytes: 0 %
Lymphocytes Relative: 34 %
Lymphs Abs: 2.2 10*3/uL (ref 0.7–4.0)
MCH: 27.9 pg (ref 26.0–34.0)
MCHC: 30.9 g/dL (ref 30.0–36.0)
MCV: 90.2 fL (ref 80.0–100.0)
Monocytes Absolute: 0.5 10*3/uL (ref 0.1–1.0)
Monocytes Relative: 7 %
Neutro Abs: 3.6 10*3/uL (ref 1.7–7.7)
Neutrophils Relative %: 54 %
Platelet Count: 213 10*3/uL (ref 150–400)
RBC: 4.3 MIL/uL (ref 3.87–5.11)
RDW: 12.7 % (ref 11.5–15.5)
WBC Count: 6.6 10*3/uL (ref 4.0–10.5)
nRBC: 0 % (ref 0.0–0.2)

## 2018-10-17 NOTE — Progress Notes (Signed)
[facility]  Telephone:([phone]) [phone] Fax:(336) [phone]     ID: Shakeyla Giebler DOB: 09-19-1940  MR#: [id number]  GMW#:[id number]  Patient Care Team: Lennox Grumbles, MD as PCP - General (Family Medicine) Stark Klein, MD as Consulting Physician (General Surgery) Kayelee Herbig, Virgie Dad, MD as Consulting Physician (Oncology) Kyung Rudd, MD as Consulting Physician (Radiation Oncology) Paralee Cancel, MD as Consulting Physician (Orthopedic Surgery) OTHER MD:   CHIEF COMPLAINT: Estrogen receptor positive breast cancer  CURRENT TREATMENT: Tamoxifen   HISTORY OF CURRENT ILLNESS: From the original intake note:  Maryann Alar had routine screening mammography on 10/03/2017 showing a possible abnormality in the right breast. She underwent unilateral right diagnostic mammography with tomography and right breast ultrasonography at the [facility] in [place], [place] on 10/17/2017 showing: breast density category C. In the right breast lower inner quadrant at 5:30 o' clock and 2 cm from the nipple, there is a hypoechoic mass that measures 0.8 x 0.9 x 0.9 cm. Additionally, at the upper outer quadrant at 10 o'clock and 7 cm from the nipple there is an ill defined area with focal mixed echogenicity. Sonographically, there was one lymph node in the right axilla with a slightly thickened cortex.   Accordingly on 11/20/2017 she proceeded to biopsy of the right breast area in question. The pathology from this procedure showed ([id number]): At 10 o' clock, benign breast tissue with no malignancy identified.  Because there was significant fibrosis in the area, this may be discordant.  At the 5:30 lower inner quadrant, invasive ductal carcinoma grade II, with calcifications. Prognostic indicators significant for: estrogen receptor, 100% positive with strong staining intensity and progesterone receptor, 40% positive with moderate staining intensity. HER 2 is negative by FISH, with a signals ratio  of 1.29, and the average number per cell 1.9.  The patient's subsequent history is as detailed below.   INTERVAL HISTORY: Rheana returns today for follow-up and treatment of her estrogen receptor positive breast cancer.  We had tried to call her and offer her a virtual visit but for some reason we could not communicate  She continues on tamoxifen.  Initially she had hot flashes and some leg cramps but both those symptoms have largely disappeared.  She was having vaginal dryness prior to starting tamoxifen but now she has a slight wetness which is actually a help.  Since her last visit here, she completed adjuvant radiation lasting from 01/21/2018 02/15/2018  Site/dose: The right breast was initially treated to 42.56 Gy in 16 fractions, followed by an 8 Gy boost delivered in 4 fractions, to yield a final total dose of 50.56 Gy.   REVIEW OF SYSTEMS: Brinlee walks about a mile on most days.  Occasionally she walks 2 miles.  She recently went to the beach with family and really enjoyed that.  They are being quite careful regarding the coronavirus.  She herself has had no symptoms of concern.  A detailed review of systems was otherwise stable.  PAST MEDICAL HISTORY: Past Medical History:  Diagnosis Date   Arthritis    OA   Asthma    Cancer ([place])    right breast   Dysrhythmia    tachycardia   Gastritis    GERD (gastroesophageal reflux disease)    H/O seasonal allergies    Hyperlipemia     PAST SURGICAL HISTORY: Past Surgical History:  Procedure Laterality Date   ABDOMINAL HYSTERECTOMY     BREAST LUMPECTOMY WITH RADIOACTIVE SEED AND SENTINEL LYMPH NODE BIOPSY Right  12/06/2017   Procedure: RIGHT BREAST LUMPECTOMY WITH RADIOACTIVE SEED AND SENTINEL LYMPH NODE BIOPSY;  Surgeon: Stark Klein, MD;  Location: [facility];  Service: General;  Laterality: Right;   CARPAL TUNNEL RELEASE Left 07/09/2014   Procedure: LEFT CARPAL TUNNEL RELEASE;  Surgeon: Daryll Brod, MD;  Location: [facility];  Service: Orthopedics;  Laterality: Left;   CESAREAN SECTION     CHOLECYSTECTOMY     DG TOES RT FOOT MULTIPLE SPECIF ([place] HX) Right    DIAGNOSTIC LAPAROSCOPY     DIGIT NAIL REMOVAL Right 12/05/2016   Procedure: REMOVAL OF SCREW RIGHT GREAT TOE;  Surgeon: Paralee Cancel, MD;  Location: [facility];  Service: Orthopedics;  Laterality: Right;   EYE SURGERY     bilateral cataract with lens implants   HAND ARTHROPLASTY Right    for OA   KNEE ARTHROSCOPY     x3   NISSEN FUNDOPLICATION     TONSILLECTOMY     TOTAL KNEE ARTHROPLASTY Right 12/05/2016   Procedure: RIGHT TOTAL KNEE ARTHROPLASTY;  Surgeon: Paralee Cancel, MD;  Location: [facility];  Service: Orthopedics;  Laterality: Right;  90 mins   TOTAL KNEE ARTHROPLASTY Left 03/20/2017   Procedure: LEFT TOTAL KNEE ARTHROPLASTY;  Surgeon: Paralee Cancel, MD;  Location: [facility];  Service: Orthopedics;  Laterality: Left;  70 mins  Appendectomy   FAMILY HISTORY Family History  Problem Relation Age of Onset   Colon cancer Mother    CAD Father 14   Lung cancer Sister    The patient's father died at age 63 of CAD. The patient's mother died at age 25 due to colon cancer. The patient had 2 brothers and 2 sisters.  1 of the patient's sisters died of lung cancer ( heavy smoker). The patient's other sister is alive with CLL. There was a maternal grandfather with colon cancer. There was a maternal grandmother with sarcoma of the leg. The patient denies a family history of breast or ovarian cancer.    GYNECOLOGIC HISTORY:  No LMP recorded. Patient has had a hysterectomy. Menarche: 78 years old Age at first live birth: 78 years old She is GXP4.  She is status post patial hysterectomy without BSO. Her LMP was around age 89. She used HRT for 10 years. She used oral contraception for 1 year in 1969.    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 Her husband, [name] was a Personal assistant. The patient's daughter [name] is  a respiratory therapist in [place], [place]. The patient's son, Virgel Bouquet lives in [place], [place] and works in Risk analyst and event planning. The patient's daughter, Junita Push lives in [place], [place] and is a Theatre manager. The patient's daughter Dr. Dyasia Firestine is a rheumatologist in [place].    ADVANCED DIRECTIVES:    HEALTH MAINTENANCE: Social History   Tobacco Use   Smoking status: Never Smoker   Smokeless tobacco: Never Used  Substance Use Topics   Alcohol use: No   Drug use: No     Colonoscopy: in [place] over 7 years ago  PAP: remote  Bone density: 12/05/2017 T score -2.5   Allergies  Allergen Reactions   Penicillins Rash and Other (See Comments)    PATIENT HAS HAD A PCN REACTION WITH IMMEDIATE RASH, FACIAL/TONGUE/THROAT SWELLING, SOB, OR LIGHTHEADEDNESS WITH HYPOTENSION:  #  #  YES  #  #  HAS PT DEVELOPED SEVERE RASH INVOLVING MUCUS MEMBRANES or SKIN NECROSIS: #  #  YES  #  #  Has patient  had a PCN reaction that required hospitalization: ALREADY HOSPITALIZED Has patient had a PCN reaction occurring within the last 10 years: No    Other Rash    Steri Strips/Surgi glue (Dermatitis) Patient developed contact dermatitis with dermabond following knee surgery and, possibly, with steri strips following her breast core biopsy   Silver Rash    Likely due to Sulfa component   Sulfa Antibiotics Rash    Current Outpatient Medications  Medication Sig Dispense Refill   albuterol (PROVENTIL HFA;VENTOLIN HFA) 108 (90 BASE) MCG/ACT inhaler Inhale 1-2 puffs into the lungs every 6 (six) hours as needed for wheezing or shortness of breath.      beta carotene w/minerals (OCUVITE) tablet Take 1 tablet by mouth 4 (four) times a week. At night.     Calcium Carb-Cholecalciferol (CALCIUM 600+D3 PO) Take 1 tablet by mouth at bedtime.     cetirizine (ZYRTEC) 10 MG tablet Take 10 mg by mouth at bedtime as needed for allergies.      estradiol (ESTRING) 2 MG vaginal ring Place 2 mg vaginally  every 3 (three) months. follow package directions 1 each 12   meloxicam (MOBIC) 15 MG tablet Take 15 mg by mouth daily as needed for pain.     metoprolol succinate (TOPROL-XL) 25 MG 24 hr tablet Take 25 mg by mouth at bedtime.      omeprazole (PRILOSEC) 20 MG capsule Take 20 mg by mouth daily as needed (for acid reflex).      simvastatin (ZOCOR) 40 MG tablet Take 40 mg by mouth every evening.      traZODone (DESYREL) 50 MG tablet Take 25 mg by mouth at bedtime as needed for sleep.     No current facility-administered medications for this visit.     OBJECTIVE: Older white woman who appears stated age  48:   10/17/18 1526  BP: 131/69  Pulse: (!) 102  Resp: 18  Temp: 98.7 F (37.1 C)  SpO2: 98%     Body mass index is 25.69 kg/m.   Wt Readings from Last 3 Encounters:  10/17/18 145 lb (65.8 kg)  03/28/18 147 lb 12.8 oz (67 kg)  01/08/18 149 lb 9.6 oz (67.9 kg)      ECOG FS:1 - Symptomatic but completely ambulatory  Sclerae unicteric, EOMs intact Wearing a mask No cervical or supraclavicular adenopathy Lungs no rales or rhonchi Heart regular rate and rhythm Abd soft, nontender, positive bowel sounds MSK no focal spinal tenderness, no upper extremity lymphedema Neuro: nonfocal, well oriented, appropriate affect Breasts: The right breast is status post lumpectomy and radiation.  There is no evidence of local recurrence.  Left breast is benign.  Both axillae are benign.   LAB RESULTS:  CMP     Component Value Date/Time   NA 141 03/28/2018 1323   K 3.7 03/28/2018 1323   CL 105 03/28/2018 1323   CO2 27 03/28/2018 1323   GLUCOSE 148 (H) 03/28/2018 1323   BUN 14 03/28/2018 1323   CREATININE 0.77 03/28/2018 1323   CALCIUM 9.0 03/28/2018 1323   PROT 6.4 (L) 03/28/2018 1323   ALBUMIN 3.6 03/28/2018 1323   AST 48 (H) 03/28/2018 1323   ALT 26 03/28/2018 1323   ALKPHOS 55 03/28/2018 1323   BILITOT 0.5 03/28/2018 1323   GFRNONAA >60 03/28/2018 1323   GFRAA >60  03/28/2018 1323    No results found for: TOTALPROTELP, ALBUMINELP, A1GS, A2GS, BETS, BETA2SER, GAMS, MSPIKE, SPEI  No results found for: KPAFRELGTCHN, LAMBDASER, KAPLAMBRATIO  Lab  Results  Component Value Date   WBC 6.6 10/17/2018   NEUTROABS 3.6 10/17/2018   HGB 12.0 10/17/2018   HCT 38.8 10/17/2018   MCV 90.2 10/17/2018   PLT 213 10/17/2018    '@LASTCHEMISTRY'$ @  No results found for: LABCA2  No components found for: ZGYFVC944  No results for input(s): INR in the last 168 hours.  No results found for: LABCA2  No results found for: HQP591  No results found for: MBW466  No results found for: ZLD357  No results found for: CA2729  No components found for: HGQUANT  No results found for: CEA1 / No results found for: CEA1   No results found for: AFPTUMOR  No results found for: CHROMOGRNA  No results found for: PSA1  Appointment on 10/17/2018  Component Date Value Ref Range Status   WBC Count 10/17/2018 6.6  4.0 - 10.5 K/uL Final   RBC 10/17/2018 4.30  3.87 - 5.11 MIL/uL Final   Hemoglobin 10/17/2018 12.0  12.0 - 15.0 g/dL Final   HCT 10/17/2018 38.8  36.0 - 46.0 % Final   MCV 10/17/2018 90.2  80.0 - 100.0 fL Final   MCH 10/17/2018 27.9  26.0 - 34.0 pg Final   MCHC 10/17/2018 30.9  30.0 - 36.0 g/dL Final   RDW 10/17/2018 12.7  11.5 - 15.5 % Final   Platelet Count 10/17/2018 213  150 - 400 K/uL Final   nRBC 10/17/2018 0.0  0.0 - 0.2 % Final   Neutrophils Relative % 10/17/2018 54  % Final   Neutro Abs 10/17/2018 3.6  1.7 - 7.7 K/uL Final   Lymphocytes Relative 10/17/2018 34  % Final   Lymphs Abs 10/17/2018 2.2  0.7 - 4.0 K/uL Final   Monocytes Relative 10/17/2018 7  % Final   Monocytes Absolute 10/17/2018 0.5  0.1 - 1.0 K/uL Final   Eosinophils Relative 10/17/2018 4  % Final   Eosinophils Absolute 10/17/2018 0.2  0.0 - 0.5 K/uL Final   Basophils Relative 10/17/2018 1  % Final   Basophils Absolute 10/17/2018 0.1  0.0 - 0.1 K/uL Final    Immature Granulocytes 10/17/2018 0  % Final   Abs Immature Granulocytes 10/17/2018 0.02  0.00 - 0.07 K/uL Final   Performed at [facility], [place] [address] [place], [place] [address]    (this displays the last labs from the last 3 days)  No results found for: TOTALPROTELP, ALBUMINELP, A1GS, A2GS, BETS, BETA2SER, GAMS, MSPIKE, SPEI (this displays SPEP labs)  No results found for: KPAFRELGTCHN, LAMBDASER, KAPLAMBRATIO (kappa/lambda light chains)  No results found for: HGBA, HGBA2QUANT, HGBFQUANT, HGBSQUAN (Hemoglobinopathy evaluation)   No results found for: LDH  No results found for: IRON, TIBC, IRONPCTSAT (Iron and TIBC)  No results found for: FERRITIN  Urinalysis No results found for: COLORURINE, APPEARANCEUR, LABSPEC, PHURINE, GLUCOSEU, HGBUR, BILIRUBINUR, KETONESUR, PROTEINUR, UROBILINOGEN, NITRITE, LEUKOCYTESUR   STUDIES: Due for mammography tomorrow  ELIGIBLE FOR AVAILABLE RESEARCH PROTOCOL: no   ASSESSMENT: 78 y.o. [place], [place] woman status post right breast biopsy x2 on 10/24/2017 for a clinical T1b NX, stage IA invasive ductal carcinoma, grade 2, estrogen and progesterone receptor positive, HER-2 not amplified  (a) biopsy of a second site in the same breast showed fibrosis, discordant  (1) status post right lumpectomy and sentinel lymph node sampling 12/06/2017 for a pT1c pN0(i), stage IA invasive ductal carcinoma, with negative margins.  (a) a total of 5 axillary lymph nodes were removed  (2) Oncotype score of 19 predicts a risk  of recurrence outside the breast in the next 9 years of 6% if her only systemic therapy is an antiestrogen for 5 years; it also predicts no significant benefit from chemotherapy.  (4) adjuvant radiation completed 01/21/2018 - 02/15/2018  Site/dose: The right breast was initially treated to 42.56 Gy in 16 fractions, followed by an 8 Gy boost delivered in 4 fractions, to yield a final total dose of 50.56 Gy.  (5)  tamoxifen started 12/21/2017  (a) status post prior hysterectomy without salpingo-oophorectomy   PLAN: Maryama is tolerating tamoxifen now remarkably well.  The plan is to continue that a minimum of 5 years.  I have encouraged her to exercise a little more and instead of doing 1 mile if she can do 2 or even 3 eventually that would be helpful.  I discussed the current Pfizer phase 3 vaccine study with her and she is interested.  They are taking appropriate pandemic precautions  She occasionally has a little wetness around the right nipple.  I think this is okay as is occasional sensitivities shooting pains or soreness.  These are all postoperative changes and they will subside with time  She will see me again in March and then again in September of next year.  She knows to call for any other issue that may develop before that visit.  Fahima Cifelli, Virgie Dad, MD  10/17/18 3:57 PM Medical Oncology and Hematology [facility] [address] [place], [place] [address] Tel. [phone]    Fax. [phone]  I, Jacqualyn Posey am acting as a Education administrator for Chauncey Cruel, MD.   I, Lurline Del MD, have reviewed the above documentation for accuracy and completeness, and I agree with the above.

## 2018-10-18 ENCOUNTER — Telehealth: Payer: Self-pay | Admitting: Oncology

## 2018-10-18 ENCOUNTER — Ambulatory Visit
Admission: RE | Admit: 2018-10-18 | Discharge: 2018-10-18 | Disposition: A | Discharge status: Home / Self Care | Payer: Medicare Other | Source: Ambulatory Visit | Attending: Oncology | Admitting: Oncology

## 2018-10-18 DIAGNOSIS — C50311 Malignant neoplasm of lower-inner quadrant of right female breast: Secondary | ICD-10-CM

## 2018-10-18 DIAGNOSIS — Z170 Estrogen receptor positive status [ER+]: Secondary | ICD-10-CM

## 2018-10-18 HISTORY — DX: Personal history of irradiation: Z92.3

## 2018-10-18 NOTE — Telephone Encounter (Signed)
Called patient regarding upcoming [facility] appointment, left a voicemail. This will be considered a phone visit due to no confirmation to set this up as a virtual visit.

## 2018-10-18 NOTE — Telephone Encounter (Signed)
I left a message regarding schedule  

## 2018-10-18 NOTE — Telephone Encounter (Signed)
Left message to verify patient telephone visit for pre reg

## 2018-10-19 NOTE — Progress Notes (Signed)
We had entered an order to change last week's visit to a virtual visit today, but the patient actually showed up last week.  See that note.

## 2018-10-21 ENCOUNTER — Inpatient Hospital Stay (HOSPITAL_BASED_OUTPATIENT_CLINIC_OR_DEPARTMENT_OTHER): Payer: Medicare Other | Admitting: Oncology

## 2018-10-21 ENCOUNTER — Other Ambulatory Visit: Payer: Medicare Other

## 2018-10-21 DIAGNOSIS — Z170 Estrogen receptor positive status [ER+]: Secondary | ICD-10-CM

## 2018-10-21 DIAGNOSIS — C50311 Malignant neoplasm of lower-inner quadrant of right female breast: Secondary | ICD-10-CM

## 2019-05-09 ENCOUNTER — Other Ambulatory Visit: Payer: Self-pay

## 2019-05-09 DIAGNOSIS — C50311 Malignant neoplasm of lower-inner quadrant of right female breast: Secondary | ICD-10-CM

## 2019-05-11 NOTE — Progress Notes (Signed)
[name]  [phone] [phone]) [phone]     ID: Rebekah Wells DOB: 02-24-1940  MR#: [id number]  BCW#:[id number]  Patient Care Team: Lennox Grumbles, MD as PCP - General (Family Medicine) Stark Klein, MD as Consulting Physician (General Surgery) Cervando Durnin, Virgie Dad, MD as Consulting Physician (Oncology) Kyung Rudd, MD as Consulting Physician (Radiation Oncology) Paralee Cancel, MD as Consulting Physician (Orthopedic Surgery) OTHER MD:   CHIEF COMPLAINT: Estrogen receptor positive breast cancer  CURRENT TREATMENT: Tamoxifen   INTERVAL HISTORY: [name] returns today for follow-up of her estrogen receptor positive breast cancer.    She continues on tamoxifen.  She is tolerating this with no significant side effects and in particular hot flashes are not a major issue.  She does not have vaginal wetness instead has some vaginal dryness problems.  Since her last visit, she underwent bilateral diagnostic mammography with tomography at [facility] [place] on 10/18/2018 showing: breast density category C; no evidence of malignancy in either breast.   REVIEW OF SYSTEMS: Shiryl feels "great".  She has been quarantining because of the pandemic and not seeing as much family as she would like.  She did get both shots of the Moderna vaccine and tolerated that well.  She is doing some YouTube type videos for exercise, Silver sneakers and Zumba.  She walks about a mile and 30 to 40 minutes.  Recall she is status post bilateral knee replacement.  She feels her knees are doing well however.  She has a little bit of soreness in the right breast area and also some mild discharge around the nipple which she wanted me to look at today.  A detailed review of systems today was otherwise noncontributory   HISTORY OF CURRENT ILLNESS: From the original intake note:  Rebekah Wells had routine screening mammography on 10/03/2017 showing a possible abnormality in the right breast.  She underwent unilateral right diagnostic mammography with tomography and right breast ultrasonography at the [facility] in [place], [place] on 10/17/2017 showing: breast density category C. In the right breast lower inner quadrant at 5:30 o' clock and 2 cm from the nipple, there is a hypoechoic mass that measures 0.8 x 0.9 x 0.9 cm. Additionally, at the upper outer quadrant at 10 o'clock and 7 cm from the nipple there is an ill defined area with focal mixed echogenicity. Sonographically, there was one lymph node in the right axilla with a slightly thickened cortex.   Accordingly on 11/20/2017 she proceeded to biopsy of the right breast area in question. The pathology from this procedure showed ([id number]): At 10 o' clock, benign breast tissue with no malignancy identified.  Because there was significant fibrosis in the area, this may be discordant.  At the 5:30 lower inner quadrant, invasive ductal carcinoma grade II, with calcifications. Prognostic indicators significant for: estrogen receptor, 100% positive with strong staining intensity and progesterone receptor, 40% positive with moderate staining intensity. HER 2 is negative by FISH, with a signals ratio of 1.29, and the average number per cell 1.9.  The patient's subsequent history is as detailed below.   PAST MEDICAL HISTORY: Past Medical History:  Diagnosis Date  . Arthritis    OA  . Asthma   . Cancer ([place])    right breast  . Dysrhythmia    tachycardia  . Gastritis   . GERD (gastroesophageal reflux disease)   . H/O seasonal allergies   . Hyperlipemia   . Personal history of radiation therapy  PAST SURGICAL HISTORY: Past Surgical History:  Procedure Laterality Date  . ABDOMINAL HYSTERECTOMY    . BREAST LUMPECTOMY Right 2019  . BREAST LUMPECTOMY WITH RADIOACTIVE SEED AND SENTINEL LYMPH NODE BIOPSY Right 12/06/2017   Procedure: RIGHT BREAST LUMPECTOMY WITH RADIOACTIVE SEED AND SENTINEL LYMPH NODE BIOPSY;  Surgeon: Stark Klein, MD;  Location: [facility];  Service: General;  Laterality: Right;  . CARPAL TUNNEL RELEASE Left 07/09/2014   Procedure: LEFT CARPAL TUNNEL RELEASE;  Surgeon: Daryll Brod, MD;  Location: [facility];  Service: Orthopedics;  Laterality: Left;  . CESAREAN SECTION    . CHOLECYSTECTOMY    . DG TOES RT FOOT MULTIPLE SPECIF ([place] HX) Right   . DIAGNOSTIC LAPAROSCOPY    . DIGIT NAIL REMOVAL Right 12/05/2016   Procedure: REMOVAL OF SCREW RIGHT GREAT TOE;  Surgeon: Paralee Cancel, MD;  Location: [facility];  Service: Orthopedics;  Laterality: Right;  . EYE SURGERY     bilateral cataract with lens implants  . HAND ARTHROPLASTY Right    for OA  . KNEE ARTHROSCOPY     x3  . NISSEN FUNDOPLICATION    . TONSILLECTOMY    . TOTAL KNEE ARTHROPLASTY Right 12/05/2016   Procedure: RIGHT TOTAL KNEE ARTHROPLASTY;  Surgeon: Paralee Cancel, MD;  Location: [facility];  Service: Orthopedics;  Laterality: Right;  90 mins  . TOTAL KNEE ARTHROPLASTY Left 03/20/2017   Procedure: LEFT TOTAL KNEE ARTHROPLASTY;  Surgeon: Paralee Cancel, MD;  Location: [facility];  Service: Orthopedics;  Laterality: Left;  70 mins  Appendectomy   FAMILY HISTORY Family History  Problem Relation Age of Onset  . Colon cancer Mother   . CAD Father 69  . Lung cancer Sister    The patient's father died at age 10 of CAD. The patient's mother died at age 62 due to colon cancer. The patient had 2 brothers and 2 sisters.  1 of the patient's sisters died of lung cancer ( heavy smoker). The patient's other sister is alive with CLL. There was a maternal grandfather with colon cancer. There was a maternal grandmother with sarcoma of the leg. The patient denies a family history of breast or ovarian cancer.    GYNECOLOGIC HISTORY:  No LMP recorded. Patient has had a hysterectomy. Menarche: 79 years old Age at first live birth: 79 years old She is GXP4.  She is status post patial hysterectomy without BSO. Her LMP was around age 41. She used HRT for  10 years. She used oral contraception for 1 year in 1969.    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 Her husband, Rebekah Wells was a Personal assistant. The patient's daughter Rebekah Wells is a respiratory therapist in [place], [place]. The patient's son, Virgel Bouquet lives in [place], [place] and works in Risk analyst and event planning. The patient's daughter, Junita Push lives in [place], [place] and is a Theatre manager. The patient's daughter Dr. Lolly Glaus is a rheumatologist in [place].    ADVANCED DIRECTIVES: In the absence of any documentation to the contrary, the patient's spouse is their HCPOA.    HEALTH MAINTENANCE: Social History   Tobacco Use  . Smoking status: Never Smoker  . Smokeless tobacco: Never Used  Substance Use Topics  . Alcohol use: No  . Drug use: No     Colonoscopy: in [place] over 7 years ago  PAP: remote  Bone density: 12/05/2017 T score -2.5   Allergies  Allergen Reactions  . Penicillins Rash and Other (See Comments)  PATIENT HAS HAD A PCN REACTION WITH IMMEDIATE RASH, FACIAL/TONGUE/THROAT SWELLING, SOB, OR LIGHTHEADEDNESS WITH HYPOTENSION:  #  #  YES  #  #  HAS PT DEVELOPED SEVERE RASH INVOLVING MUCUS MEMBRANES or SKIN NECROSIS: #  #  YES  #  #  Has patient had a PCN reaction that required hospitalization: ALREADY HOSPITALIZED Has patient had a PCN reaction occurring within the last 10 years: No   . Other Rash    Steri Strips/Surgi glue (Dermatitis) Patient developed contact dermatitis with dermabond following knee surgery and, possibly, with steri strips following her breast core biopsy  . Silver Rash    Likely due to Sulfa component  . Sulfa Antibiotics Rash    Current Outpatient Medications  Medication Sig Dispense Refill  . albuterol (PROVENTIL HFA;VENTOLIN HFA) 108 (90 BASE) MCG/ACT inhaler Inhale 1-2 puffs into the lungs every 6 (six) hours as needed for wheezing or shortness of breath.     . beta carotene w/minerals (OCUVITE) tablet Take 1 tablet by mouth  4 (four) times a week. At night.    . Calcium Carb-Cholecalciferol (CALCIUM 600+D3 PO) Take 1 tablet by mouth at bedtime.    . cetirizine (ZYRTEC) 10 MG tablet Take 10 mg by mouth at bedtime as needed for allergies.     Marland Kitchen estradiol (ESTRING) 2 MG vaginal ring Place 2 mg vaginally every 3 (three) months. follow package directions 1 each 12  . meloxicam (MOBIC) 15 MG tablet Take 15 mg by mouth daily as needed for pain.    . metoprolol succinate (TOPROL-XL) 25 MG 24 hr tablet Take 25 mg by mouth at bedtime.     . metroNIDAZOLE (METROGEL) 1 % gel Apply topically daily. 45 g 0  . omeprazole (PRILOSEC) 20 MG capsule Take 20 mg by mouth daily as needed (for acid reflex).     . simvastatin (ZOCOR) 40 MG tablet Take 40 mg by mouth every evening.     . traZODone (DESYREL) 50 MG tablet Take 25 mg by mouth at bedtime as needed for sleep.     No current facility-administered medications for this visit.    OBJECTIVE: White woman in no acute distress  Vitals:   05/12/19 1514  BP: 112/69  Pulse: 100  Resp: 18  Temp: 98.9 F (37.2 C)  SpO2: 96%     Body mass index is 26.68 kg/m.   Wt Readings from Last 3 Encounters:  05/12/19 150 lb 9.6 oz (68.3 kg)  10/17/18 145 lb (65.8 kg)  03/28/18 147 lb 12.8 oz (67 kg)      ECOG FS:1 - Symptomatic but completely ambulatory  Sclerae unicteric, EOMs intact Wearing a mask No cervical or supraclavicular adenopathy Lungs no rales or rhonchi Heart regular rate and rhythm Abd soft, nontender, positive bowel sounds MSK no focal spinal tenderness, no upper extremity lymphedema Neuro: nonfocal, well oriented, appropriate affect Breasts: The right breast has undergone lumpectomy and radiation.  The cosmetic result is good.  There is no evidence of disease recurrence.  There is a minimal area of irritation in the lateral aspect of the nipple. the left breast is benign.  Both axillae are benign.   LAB RESULTS:  CMP     Component Value Date/Time   NA 139  10/17/2018 1523   K 4.0 10/17/2018 1523   CL 107 10/17/2018 1523   CO2 23 10/17/2018 1523   GLUCOSE 179 (H) 10/17/2018 1523   BUN 16 10/17/2018 1523   CREATININE 0.89 10/17/2018 1523  CALCIUM 8.5 (L) 10/17/2018 1523   PROT 6.6 10/17/2018 1523   ALBUMIN 3.6 10/17/2018 1523   AST 44 (H) 10/17/2018 1523   ALT 27 10/17/2018 1523   ALKPHOS 62 10/17/2018 1523   BILITOT 0.2 (L) 10/17/2018 1523   GFRNONAA >60 10/17/2018 1523   GFRAA >60 10/17/2018 1523    No results found for: TOTALPROTELP, ALBUMINELP, A1GS, A2GS, BETS, BETA2SER, GAMS, MSPIKE, SPEI  No results found for: KPAFRELGTCHN, LAMBDASER, KAPLAMBRATIO  Lab Results  Component Value Date   WBC 6.6 05/12/2019   NEUTROABS 3.4 05/12/2019   HGB 11.3 (L) 05/12/2019   HCT 37.1 05/12/2019   MCV 91.6 05/12/2019   PLT 207 05/12/2019   No results found for: LABCA2  No components found for: VVOHYW737  No results for input(s): INR in the last 168 hours.  No results found for: LABCA2  No results found for: TGG269  No results found for: SWN462  No results found for: VOJ500  No results found for: CA2729  No components found for: HGQUANT  No results found for: CEA1 / No results found for: CEA1   No results found for: AFPTUMOR  No results found for: CHROMOGRNA  No results found for: HGBA, HGBA2QUANT, HGBFQUANT, HGBSQUAN (Hemoglobinopathy evaluation)   No results found for: LDH  No results found for: IRON, TIBC, IRONPCTSAT (Iron and TIBC)  No results found for: FERRITIN  Urinalysis No results found for: COLORURINE, APPEARANCEUR, LABSPEC, PHURINE, GLUCOSEU, HGBUR, BILIRUBINUR, KETONESUR, PROTEINUR, UROBILINOGEN, NITRITE, LEUKOCYTESUR   STUDIES: No results found.   ELIGIBLE FOR AVAILABLE RESEARCH PROTOCOL: no   ASSESSMENT: 79 y.o. [place], [place] woman status post right breast biopsy x2 on 10/24/2017 for a clinical T1b NX, stage IA invasive ductal carcinoma, grade 2, estrogen and progesterone receptor positive,  HER-2 not amplified  (a) biopsy of a second site in the same breast showed fibrosis, discordant  (1) status post right lumpectomy and sentinel lymph node sampling 12/06/2017 for a pT1c pN0(i), stage IA invasive ductal carcinoma, with negative margins.  (a) a total of 5 axillary lymph nodes were removed  (2) Oncotype score of 19 predicts a risk of recurrence outside the breast in the next 9 years of 6% if her only systemic therapy is an antiestrogen for 5 years; it also predicts no significant benefit from chemotherapy.  (4) adjuvant radiation completed 01/21/2018 - 02/15/2018  Site/dose: The right breast was initially treated to 42.56 Gy in 16 fractions, followed by an 8 Gy boost delivered in 4 fractions, to yield a final total dose of 50.56 Gy.  (5) tamoxifen started 12/21/2017  (a) status post prior hysterectomy without salpingo-oophorectomy   PLAN: Lakeita is now a year and a half out from definitive surgery for breast cancer with no evidence of disease recurrence.  This is very favorable.  She is tolerating tamoxifen well and the plan will be to continue that for 5 years.  My nurse discussed vaginal dryness issues with her.  She would be a very good candidate for our pelvic program, but driving in our here for rehab is more than she can really do.  I am starting her on metronidazole cream to use for the nipple irritation area and we discussed how she should apply it and how long she should do it for.  She will have mammography on September 20 which is also the day when her husband has an appointment with his doctor here.  She will see me that afternoon.  From that point I will start seeing her  on a once a year basis  She knows to call for any other issue that may develop before then  Total encounter time 25 minutes.*   Rebekah Wells, Virgie Dad, MD  05/12/19 3:31 PM Medical Oncology and Hematology [facility] [place], [place] [address] Tel. [phone]     Fax. [phone]   I, Wilburn Mylar, am acting as scribe for Dr. Virgie Dad. Gabriela Irigoyen.  I, Lurline Del MD, have reviewed the above documentation for accuracy and completeness, and I agree with the above.    *Total Encounter Time as defined by the Centers for Medicare and Medicaid Services includes, in addition to the face-to-face time of a patient visit (documented in the note above) non-face-to-face time: obtaining and reviewing outside history, ordering and reviewing medications, tests or procedures, care coordination (communications with other health care professionals or caregivers) and documentation in the medical record.

## 2019-05-12 ENCOUNTER — Inpatient Hospital Stay: Payer: Medicare Other | Attending: Oncology | Admitting: Oncology

## 2019-05-12 ENCOUNTER — Inpatient Hospital Stay: Payer: Medicare Other

## 2019-05-12 ENCOUNTER — Other Ambulatory Visit: Payer: Self-pay | Admitting: Oncology

## 2019-05-12 ENCOUNTER — Other Ambulatory Visit: Payer: Self-pay

## 2019-05-12 VITALS — BP 112/69 | HR 100 | Temp 98.9°F | Resp 18 | Ht 63.0 in | Wt 150.6 lb

## 2019-05-12 DIAGNOSIS — Z90722 Acquired absence of ovaries, bilateral: Secondary | ICD-10-CM | POA: Insufficient documentation

## 2019-05-12 DIAGNOSIS — C50911 Malignant neoplasm of unspecified site of right female breast: Secondary | ICD-10-CM | POA: Diagnosis not present

## 2019-05-12 DIAGNOSIS — C50311 Malignant neoplasm of lower-inner quadrant of right female breast: Secondary | ICD-10-CM | POA: Diagnosis not present

## 2019-05-12 DIAGNOSIS — Z9079 Acquired absence of other genital organ(s): Secondary | ICD-10-CM | POA: Diagnosis not present

## 2019-05-12 DIAGNOSIS — Z79810 Long term (current) use of selective estrogen receptor modulators (SERMs): Secondary | ICD-10-CM | POA: Insufficient documentation

## 2019-05-12 DIAGNOSIS — Z96653 Presence of artificial knee joint, bilateral: Secondary | ICD-10-CM | POA: Diagnosis not present

## 2019-05-12 DIAGNOSIS — Z170 Estrogen receptor positive status [ER+]: Secondary | ICD-10-CM

## 2019-05-12 DIAGNOSIS — Z90710 Acquired absence of both cervix and uterus: Secondary | ICD-10-CM | POA: Diagnosis not present

## 2019-05-12 LAB — CMP (CANCER CENTER ONLY)
ALT: 28 U/L (ref 0–44)
AST: 56 U/L — ABNORMAL HIGH (ref 15–41)
Albumin: 3.5 g/dL (ref 3.5–5.0)
Alkaline Phosphatase: 57 U/L (ref 38–126)
Anion gap: 8 (ref 5–15)
BUN: 13 mg/dL (ref 8–23)
CO2: 26 mmol/L (ref 22–32)
Calcium: 8.6 mg/dL — ABNORMAL LOW (ref 8.9–10.3)
Chloride: 107 mmol/L (ref 98–111)
Creatinine: 0.87 mg/dL (ref 0.44–1.00)
GFR, Est AFR Am: >60 mL/min (ref >60)
GFR, Estimated: >60 mL/min (ref >60)
Glucose, Bld: 168 mg/dL — ABNORMAL HIGH (ref 70–99)
Potassium: 3.9 mmol/L (ref 3.5–5.1)
Sodium: 141 mmol/L (ref 135–145)
Total Bilirubin: 0.3 mg/dL (ref 0.3–1.2)
Total Protein: 6.1 g/dL — ABNORMAL LOW (ref 6.5–8.1)

## 2019-05-12 LAB — CBC WITH DIFFERENTIAL (CANCER CENTER ONLY)
Abs Immature Granulocytes: 0.01 10*3/uL (ref 0.00–0.07)
Basophils Absolute: 0.1 10*3/uL (ref 0.0–0.1)
Basophils Relative: 1 %
Eosinophils Absolute: 0.3 10*3/uL (ref 0.0–0.5)
Eosinophils Relative: 4 %
HCT: 37.1 % (ref 36.0–46.0)
Hemoglobin: 11.3 g/dL — ABNORMAL LOW (ref 12.0–15.0)
Immature Granulocytes: 0 %
Lymphocytes Relative: 36 %
Lymphs Abs: 2.3 10*3/uL (ref 0.7–4.0)
MCH: 27.9 pg (ref 26.0–34.0)
MCHC: 30.5 g/dL (ref 30.0–36.0)
MCV: 91.6 fL (ref 80.0–100.0)
Monocytes Absolute: 0.5 10*3/uL (ref 0.1–1.0)
Monocytes Relative: 7 %
Neutro Abs: 3.4 10*3/uL (ref 1.7–7.7)
Neutrophils Relative %: 52 %
Platelet Count: 207 10*3/uL (ref 150–400)
RBC: 4.05 MIL/uL (ref 3.87–5.11)
RDW: 13.1 % (ref 11.5–15.5)
WBC Count: 6.6 10*3/uL (ref 4.0–10.5)
nRBC: 0 % (ref 0.0–0.2)

## 2019-05-12 MED ORDER — METRONIDAZOLE 1 % EX GEL: Freq: Every day | CUTANEOUS | 0 refills | Status: AC

## 2019-05-13 ENCOUNTER — Telehealth: Payer: Self-pay | Admitting: Oncology

## 2019-05-13 NOTE — Telephone Encounter (Signed)
Scheduled appt per 3/22 los. Left voicemail with appt details. Mailed reminder letter and calendar.  

## 2019-06-13 ENCOUNTER — Other Ambulatory Visit: Payer: Self-pay | Admitting: Oncology

## 2019-11-10 ENCOUNTER — Other Ambulatory Visit: Payer: Medicare Other

## 2019-11-10 ENCOUNTER — Ambulatory Visit: Payer: Medicare Other | Admitting: Oncology

## 2019-11-12 ENCOUNTER — Ambulatory Visit: Payer: Medicare Other | Admitting: Oncology

## 2019-11-12 ENCOUNTER — Other Ambulatory Visit: Payer: Medicare Other

## 2019-11-13 ENCOUNTER — Inpatient Hospital Stay (HOSPITAL_BASED_OUTPATIENT_CLINIC_OR_DEPARTMENT_OTHER): Payer: Medicare Other | Admitting: Oncology

## 2019-11-13 ENCOUNTER — Inpatient Hospital Stay: Payer: Medicare Other | Attending: Oncology

## 2019-11-13 ENCOUNTER — Other Ambulatory Visit: Payer: Self-pay

## 2019-11-13 VITALS — BP 106/73 | HR 87 | Temp 97.4°F | Resp 18 | Ht 63.0 in | Wt 147.8 lb

## 2019-11-13 DIAGNOSIS — Z170 Estrogen receptor positive status [ER+]: Secondary | ICD-10-CM | POA: Insufficient documentation

## 2019-11-13 DIAGNOSIS — C50911 Malignant neoplasm of unspecified site of right female breast: Secondary | ICD-10-CM | POA: Insufficient documentation

## 2019-11-13 DIAGNOSIS — Z79810 Long term (current) use of selective estrogen receptor modulators (SERMs): Secondary | ICD-10-CM | POA: Insufficient documentation

## 2019-11-13 DIAGNOSIS — Z96653 Presence of artificial knee joint, bilateral: Secondary | ICD-10-CM | POA: Diagnosis not present

## 2019-11-13 DIAGNOSIS — Z9079 Acquired absence of other genital organ(s): Secondary | ICD-10-CM | POA: Insufficient documentation

## 2019-11-13 DIAGNOSIS — Z801 Family history of malignant neoplasm of trachea, bronchus and lung: Secondary | ICD-10-CM | POA: Insufficient documentation

## 2019-11-13 DIAGNOSIS — Z90722 Acquired absence of ovaries, bilateral: Secondary | ICD-10-CM | POA: Diagnosis not present

## 2019-11-13 DIAGNOSIS — C50311 Malignant neoplasm of lower-inner quadrant of right female breast: Secondary | ICD-10-CM | POA: Diagnosis not present

## 2019-11-13 DIAGNOSIS — Z90710 Acquired absence of both cervix and uterus: Secondary | ICD-10-CM | POA: Diagnosis not present

## 2019-11-13 DIAGNOSIS — Z923 Personal history of irradiation: Secondary | ICD-10-CM | POA: Insufficient documentation

## 2019-11-13 DIAGNOSIS — Z800 Family history of malignant neoplasm of digestive organs: Secondary | ICD-10-CM | POA: Diagnosis not present

## 2019-11-13 LAB — COMPREHENSIVE METABOLIC PANEL
ALT: 35 U/L (ref 0–44)
AST: 50 U/L — ABNORMAL HIGH (ref 15–41)
Albumin: 3.4 g/dL — ABNORMAL LOW (ref 3.5–5.0)
Alkaline Phosphatase: 59 U/L (ref 38–126)
Anion gap: 6 (ref 5–15)
BUN: 12 mg/dL (ref 8–23)
CO2: 27 mmol/L (ref 22–32)
Calcium: 9 mg/dL (ref 8.9–10.3)
Chloride: 105 mmol/L (ref 98–111)
Creatinine, Ser: 0.69 mg/dL (ref 0.44–1.00)
GFR calc Af Amer: >60 mL/min (ref >60)
GFR calc non Af Amer: >60 mL/min (ref >60)
Glucose, Bld: 109 mg/dL — ABNORMAL HIGH (ref 70–99)
Potassium: 3.9 mmol/L (ref 3.5–5.1)
Sodium: 138 mmol/L (ref 135–145)
Total Bilirubin: 0.3 mg/dL (ref 0.3–1.2)
Total Protein: 6.5 g/dL (ref 6.5–8.1)

## 2019-11-13 LAB — CBC WITH DIFFERENTIAL/PLATELET
Abs Immature Granulocytes: 0.01 10*3/uL (ref 0.00–0.07)
Basophils Absolute: 0.1 10*3/uL (ref 0.0–0.1)
Basophils Relative: 1 %
Eosinophils Absolute: 0.1 10*3/uL (ref 0.0–0.5)
Eosinophils Relative: 2 %
HCT: 36.6 % (ref 36.0–46.0)
Hemoglobin: 11.4 g/dL — ABNORMAL LOW (ref 12.0–15.0)
Immature Granulocytes: 0 %
Lymphocytes Relative: 35 %
Lymphs Abs: 2.5 10*3/uL (ref 0.7–4.0)
MCH: 29 pg (ref 26.0–34.0)
MCHC: 31.1 g/dL (ref 30.0–36.0)
MCV: 93.1 fL (ref 80.0–100.0)
Monocytes Absolute: 0.6 10*3/uL (ref 0.1–1.0)
Monocytes Relative: 9 %
Neutro Abs: 3.7 10*3/uL (ref 1.7–7.7)
Neutrophils Relative %: 53 %
Platelets: 188 10*3/uL (ref 150–400)
RBC: 3.93 MIL/uL (ref 3.87–5.11)
RDW: 12.5 % (ref 11.5–15.5)
WBC: 7 10*3/uL (ref 4.0–10.5)
nRBC: 0 % (ref 0.0–0.2)

## 2019-11-13 MED ORDER — TAMOXIFEN CITRATE 20 MG PO TABS: 20.0000 mg | ORAL_TABLET | Freq: Every day | ORAL | 4 refills | Status: DC

## 2019-11-13 NOTE — Progress Notes (Signed)
[facility]  [phone] [phone]) [phone]     ID: Rebekah Wells DOB: 1941-02-01  MR#: [id number]  LFY#:[id number]  Patient Care Team: Lennox Grumbles, MD as PCP - General (Family Medicine) Stark Klein, MD as Consulting Physician (General Surgery) Aina Rossbach, Virgie Dad, MD as Consulting Physician (Oncology) Kyung Rudd, MD as Consulting Physician (Radiation Oncology) Paralee Cancel, MD as Consulting Physician (Orthopedic Surgery) OTHER MD:   CHIEF COMPLAINT: Estrogen receptor positive breast cancer  CURRENT TREATMENT: Tamoxifen   INTERVAL HISTORY: Rebekah Wells returns today for follow-up of her estrogen receptor positive breast cancer accompanied by her husband.    She continues on tamoxifen She had some problems on this initially but now is doing much better and particularly of vaginal dryness problems have greatly improved.  She never started the Estring.  She is scheduled for bilateral mammography at the [facility] 11/14/2019.  She tells me she would like to have future studies done at the [facility] since every time they go to the breast center and they get lost getting back.  REVIEW OF SYSTEMS: Rebekah Wells had the Materna vaccine x2 and is planning to get the booster this weekend.  She exercises with Silver sneakers, doing chair yoga, and plans to walk now that the weather is a little bit better.  Overall she has "no problems".  A detailed review of systems today was stable  HISTORY OF CURRENT ILLNESS: From the original intake note:  Rebekah Wells had routine screening mammography on 10/03/2017 showing a possible abnormality in the right breast. She underwent unilateral right diagnostic mammography with tomography and right breast ultrasonography at the [facility] in [place], [place] on 10/17/2017 showing: breast density category C. In the right breast lower inner quadrant at 5:30 o' clock and 2 cm from the nipple, there is a hypoechoic mass that  measures 0.8 x 0.9 x 0.9 cm. Additionally, at the upper outer quadrant at 10 o'clock and 7 cm from the nipple there is an ill defined area with focal mixed echogenicity. Sonographically, there was one lymph node in the right axilla with a slightly thickened cortex.   Accordingly on 11/20/2017 she proceeded to biopsy of the right breast area in question. The pathology from this procedure showed ([id number]): At 10 o' clock, benign breast tissue with no malignancy identified.  Because there was significant fibrosis in the area, this may be discordant.  At the 5:30 lower inner quadrant, invasive ductal carcinoma grade II, with calcifications. Prognostic indicators significant for: estrogen receptor, 100% positive with strong staining intensity and progesterone receptor, 40% positive with moderate staining intensity. HER 2 is negative by FISH, with a signals ratio of 1.29, and the average number per cell 1.9.  The patient's subsequent history is as detailed below.   PAST MEDICAL HISTORY: Past Medical History:  Diagnosis Date  . Arthritis    OA  . Asthma   . Cancer ([place])    right breast  . Dysrhythmia    tachycardia  . Gastritis   . GERD (gastroesophageal reflux disease)   . H/O seasonal allergies   . Hyperlipemia   . Personal history of radiation therapy     PAST SURGICAL HISTORY: Past Surgical History:  Procedure Laterality Date  . ABDOMINAL HYSTERECTOMY    . BREAST LUMPECTOMY Right 2019  . BREAST LUMPECTOMY WITH RADIOACTIVE SEED AND SENTINEL LYMPH NODE BIOPSY Right 12/06/2017   Procedure: RIGHT BREAST LUMPECTOMY WITH RADIOACTIVE SEED AND SENTINEL LYMPH NODE BIOPSY;  Surgeon: Stark Klein, MD;  Location: [facility];  Service: General;  Laterality: Right;  . CARPAL TUNNEL RELEASE Left 07/09/2014   Procedure: LEFT CARPAL TUNNEL RELEASE;  Surgeon: Daryll Brod, MD;  Location: [facility];  Service: Orthopedics;  Laterality: Left;  . CESAREAN SECTION    . CHOLECYSTECTOMY    . DG  TOES RT FOOT MULTIPLE SPECIF ([place] [facility]) Right   . DIAGNOSTIC LAPAROSCOPY    . DIGIT NAIL REMOVAL Right 12/05/2016   Procedure: REMOVAL OF SCREW RIGHT GREAT TOE;  Surgeon: Paralee Cancel, MD;  Location: [facility];  Service: Orthopedics;  Laterality: Right;  . EYE SURGERY     bilateral cataract with lens implants  . HAND ARTHROPLASTY Right    for OA  . KNEE ARTHROSCOPY     x3  . NISSEN FUNDOPLICATION    . TONSILLECTOMY    . TOTAL KNEE ARTHROPLASTY Right 12/05/2016   Procedure: RIGHT TOTAL KNEE ARTHROPLASTY;  Surgeon: Paralee Cancel, MD;  Location: [facility];  Service: Orthopedics;  Laterality: Right;  90 mins  . TOTAL KNEE ARTHROPLASTY Left 03/20/2017   Procedure: LEFT TOTAL KNEE ARTHROPLASTY;  Surgeon: Paralee Cancel, MD;  Location: [facility];  Service: Orthopedics;  Laterality: Left;  70 mins  Appendectomy   FAMILY HISTORY Family History  Problem Relation Age of Onset  . Colon cancer Mother   . CAD Father 27  . Lung cancer Sister    The patient's father died at age 3 of CAD. The patient's mother died at age 52 due to colon cancer. The patient had 2 brothers and 2 sisters.  1 of the patient's sisters died of lung cancer ( heavy smoker). The patient's other sister is alive with CLL. There was a maternal grandfather with colon cancer. There was a maternal grandmother with sarcoma of the leg. The patient denies a family history of breast or ovarian cancer.    GYNECOLOGIC HISTORY:  No LMP recorded. Patient has had a hysterectomy. Menarche: 79 years old Age at first live birth: 79 years old She is GXP4.  She is status post patial hysterectomy without BSO. Her LMP was around age 46. She used HRT for 10 years. She used oral contraception for 1 year in 1969.    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 Her husband, [name] was a Personal assistant. The patient's daughter Rebekah Wells is a respiratory therapist in [place], [place]. The patient's son, Rebekah Wells lives in [place], [place] and works in Microbiologist and event planning. The patient's daughter, Rebekah Wells lives in [place], [place] and is a Theatre manager. The patient's daughter Dr. Hajar Penninger is a rheumatologist in [place].    ADVANCED DIRECTIVES: In the absence of any documentation to the contrary, the patient's spouse is their HCPOA.    HEALTH MAINTENANCE: Social History   Tobacco Use  . Smoking status: Never Smoker  . Smokeless tobacco: Never Used  Vaping Use  . Vaping Use: Never used  Substance Use Topics  . Alcohol use: No  . Drug use: No     Colonoscopy: in [place] over 7 years ago  PAP: remote  Bone density: 12/05/2017 T score -2.5   Allergies  Allergen Reactions  . Penicillins Rash and Other (See Comments)    PATIENT HAS HAD A PCN REACTION WITH IMMEDIATE RASH, FACIAL/TONGUE/THROAT SWELLING, SOB, OR LIGHTHEADEDNESS WITH HYPOTENSION:  #  #  YES  #  #  HAS PT DEVELOPED SEVERE RASH INVOLVING MUCUS MEMBRANES or SKIN NECROSIS: #  #  YES  #  #  Has patient had a PCN reaction that required hospitalization: ALREADY HOSPITALIZED Has patient had a PCN reaction occurring within the last 10 years: No   . Other Rash    Steri Strips/Surgi glue (Dermatitis) Patient developed contact dermatitis with dermabond following knee surgery and, possibly, with steri strips following her breast core biopsy  . Silver Rash    Likely due to Sulfa component  . Sulfa Antibiotics Rash    Current Outpatient Medications  Medication Sig Dispense Refill  . albuterol (PROVENTIL HFA;VENTOLIN HFA) 108 (90 BASE) MCG/ACT inhaler Inhale 1-2 puffs into the lungs every 6 (six) hours as needed for wheezing or shortness of breath.     . beta carotene w/minerals (OCUVITE) tablet Take 1 tablet by mouth 4 (four) times a week. At night.    . Calcium Carb-Cholecalciferol (CALCIUM 600+D3 PO) Take 1 tablet by mouth at bedtime.    . cetirizine (ZYRTEC) 10 MG tablet Take 10 mg by mouth at bedtime as needed for allergies.     Marland Kitchen estradiol (ESTRING) 2 MG vaginal ring  Place 2 mg vaginally every 3 (three) months. follow package directions 1 each 12  . meloxicam (MOBIC) 15 MG tablet Take 15 mg by mouth daily as needed for pain.    . metoprolol succinate (TOPROL-XL) 25 MG 24 hr tablet Take 25 mg by mouth at bedtime.     . metroNIDAZOLE (METROGEL) 1 % gel Apply topically daily. 45 g 0  . montelukast (SINGULAIR) 5 MG chewable tablet Chew 1 tablet (5 mg total) by mouth at bedtime.    Marland Kitchen omeprazole (PRILOSEC) 20 MG capsule Take 20 mg by mouth daily as needed (for acid reflex).     . simvastatin (ZOCOR) 40 MG tablet Take 40 mg by mouth every evening.     . tamoxifen (NOLVADEX) 20 MG tablet TAKE ONE TABLET BY MOUTH DAILY 90 tablet 10  . traZODone (DESYREL) 50 MG tablet Take 25 mg by mouth at bedtime as needed for sleep.     No current facility-administered medications for this visit.    OBJECTIVE: White woman who appears younger than stated age  23:   11/13/19 1430  BP: 106/73  Pulse: 87  Resp: 18  Temp: (!) 97.4 F (36.3 C)  SpO2: 98%     Body mass index is 26.18 kg/m.   Wt Readings from Last 3 Encounters:  11/13/19 147 lb 12.8 oz (67 kg)  05/12/19 150 lb 9.6 oz (68.3 kg)  10/17/18 145 lb (65.8 kg)      ECOG FS:1 - Symptomatic but completely ambulatory  Sclerae unicteric, EOMs intact Wearing a mask No cervical or supraclavicular adenopathy Lungs no rales or rhonchi Heart regular rate and rhythm Abd soft, nontender, positive bowel sounds MSK no focal spinal tenderness, no upper extremity lymphedema Neuro: nonfocal, well oriented, appropriate affect Breasts: The right breast is status post lumpectomy followed by radiation with no evidence of local recurrence per the left breast is benign.  Both axillae are benign.   LAB RESULTS:  CMP     Component Value Date/Time   NA 138 11/13/2019 1352   K 3.9 11/13/2019 1352   CL 105 11/13/2019 1352   CO2 27 11/13/2019 1352   GLUCOSE 109 (H) 11/13/2019 1352   BUN 12 11/13/2019 1352   CREATININE  0.69 11/13/2019 1352   CREATININE 0.87 05/12/2019 1449   CALCIUM 9.0 11/13/2019 1352   PROT 6.5 11/13/2019 1352   ALBUMIN 3.4 (L) 11/13/2019 1352   AST 50 (H) 11/13/2019  1352   AST 56 (H) 05/12/2019 1449   ALT 35 11/13/2019 1352   ALT 28 05/12/2019 1449   ALKPHOS 59 11/13/2019 1352   BILITOT 0.3 11/13/2019 1352   BILITOT 0.3 05/12/2019 1449   GFRNONAA >60 11/13/2019 1352   GFRNONAA >60 05/12/2019 1449   GFRAA >60 11/13/2019 1352   GFRAA >60 05/12/2019 1449    No results found for: TOTALPROTELP, ALBUMINELP, A1GS, A2GS, BETS, BETA2SER, GAMS, MSPIKE, SPEI  No results found for: KPAFRELGTCHN, LAMBDASER, KAPLAMBRATIO  Lab Results  Component Value Date   WBC 7.0 11/13/2019   NEUTROABS 3.7 11/13/2019   HGB 11.4 (L) 11/13/2019   HCT 36.6 11/13/2019   MCV 93.1 11/13/2019   PLT 188 11/13/2019   No results found for: LABCA2  No components found for: [id number]  No results for input(s): INR in the last 168 hours.  No results found for: LABCA2  No results found for: WUJ811  No results found for: BJY782  No results found for: NFA213  No results found for: CA2729  No components found for: HGQUANT  No results found for: CEA1 / No results found for: CEA1   No results found for: AFPTUMOR  No results found for: CHROMOGRNA  No results found for: HGBA, HGBA2QUANT, HGBFQUANT, HGBSQUAN (Hemoglobinopathy evaluation)   No results found for: LDH  No results found for: IRON, TIBC, IRONPCTSAT (Iron and TIBC)  No results found for: FERRITIN  Urinalysis No results found for: COLORURINE, APPEARANCEUR, LABSPEC, PHURINE, GLUCOSEU, HGBUR, BILIRUBINUR, KETONESUR, PROTEINUR, UROBILINOGEN, NITRITE, LEUKOCYTESUR   STUDIES: No results found.   ELIGIBLE FOR AVAILABLE RESEARCH PROTOCOL: no   ASSESSMENT: 79 y.o. [place], [place] woman status post right breast biopsy x2 on 10/24/2017 for a clinical T1b NX, stage IA invasive ductal carcinoma, grade 2, estrogen and progesterone receptor  positive, HER-2 not amplified  (a) biopsy of a second site in the same breast showed fibrosis, discordant  (1) status post right lumpectomy and sentinel lymph node sampling 12/06/2017 for a pT1c pN0(i), stage IA invasive ductal carcinoma, with negative margins.  (a) a total of 5 axillary lymph nodes were removed  (2) Oncotype score of 19 predicts a risk of recurrence outside the breast in the next 9 years of 6% if her only systemic therapy is an antiestrogen for 5 years; it also predicts no significant benefit from chemotherapy.  (4) adjuvant radiation completed 01/21/2018 - 02/15/2018  Site/dose: The right breast was initially treated to 42.56 Gy in 16 fractions, followed by an 8 Gy boost delivered in 4 fractions, to yield a final total dose of 50.56 Gy.  (5) tamoxifen started 12/21/2017  (a) status post prior hysterectomy without salpingo-oophorectomy   PLAN: Mekiyah is now just about 2 years out from definitive surgery for her breast cancer with no evidence of disease recurrence.  This is very favorable.  She is tolerating tamoxifen remarkably well and the plan will be to continue that a total of 5 years.  She is due for a bone density next month.  She would like to get that done in [place] and we will try to schedule that there for her.  Similarly she would prefer to have all her future mammograms in [place].  I of course have no problem with any of that.  She will return to see me October 2022.  She knows to call for any other issue that may develop before the next visit.  Total encounter time 25 minutes.*   Leonila Speranza, Virgie Dad, MD  11/13/19 2:42  PM Medical Oncology and Hematology [facility] [place], [place] [address] Tel. [phone]    Fax. [phone]   I, Wilburn Mylar, am acting as scribe for Dr. Virgie Dad. Boruch Manuele.  I, Lurline Del MD, have reviewed the above documentation for accuracy and completeness, and I agree with  the above.   *Total Encounter Time as defined by the Centers for Medicare and Medicaid Services includes, in addition to the face-to-face time of a patient visit (documented in the note above) non-face-to-face time: obtaining and reviewing outside history, ordering and reviewing medications, tests or procedures, care coordination (communications with other health care professionals or caregivers) and documentation in the medical record.

## 2019-11-14 ENCOUNTER — Ambulatory Visit
Admission: RE | Admit: 2019-11-14 | Discharge: 2019-11-14 | Disposition: A | Discharge status: Home / Self Care | Payer: Medicare Other | Source: Ambulatory Visit | Attending: Oncology | Admitting: Oncology

## 2019-11-14 DIAGNOSIS — C50311 Malignant neoplasm of lower-inner quadrant of right female breast: Secondary | ICD-10-CM

## 2019-11-17 ENCOUNTER — Telehealth: Payer: Self-pay | Admitting: Oncology

## 2019-11-17 NOTE — Telephone Encounter (Signed)
Scheduled appts per 9/23 los. Left voicemail with appt date and times.

## 2020-05-03 ENCOUNTER — Other Ambulatory Visit: Payer: Self-pay | Admitting: Oncology

## 2020-05-03 ENCOUNTER — Telehealth: Payer: Self-pay | Admitting: Oncology

## 2020-05-03 DIAGNOSIS — Z853 Personal history of malignant neoplasm of breast: Secondary | ICD-10-CM

## 2020-05-03 DIAGNOSIS — Z1231 Encounter for screening mammogram for malignant neoplasm of breast: Secondary | ICD-10-CM

## 2020-05-03 DIAGNOSIS — Z9889 Other specified postprocedural states: Secondary | ICD-10-CM

## 2020-05-03 NOTE — Telephone Encounter (Signed)
Called pt to r/s appointments. No answer. Left msg for pt to call back to r/s.

## 2020-06-23 ENCOUNTER — Other Ambulatory Visit: Payer: Self-pay | Admitting: Oncology

## 2020-06-25 ENCOUNTER — Other Ambulatory Visit: Payer: Self-pay | Admitting: *Deleted

## 2020-06-25 MED ORDER — TAMOXIFEN CITRATE 20 MG PO TABS: 20.0000 mg | ORAL_TABLET | Freq: Every day | ORAL | 0 refills | Status: DC

## 2020-12-16 ENCOUNTER — Other Ambulatory Visit: Payer: Self-pay

## 2020-12-16 ENCOUNTER — Inpatient Hospital Stay: Payer: Medicare Other

## 2020-12-16 ENCOUNTER — Inpatient Hospital Stay: Payer: Medicare Other | Attending: Oncology | Admitting: Oncology

## 2020-12-16 VITALS — BP 101/52 | HR 88 | Temp 98.4°F | Resp 18 | Ht 63.0 in | Wt 142.4 lb

## 2020-12-16 DIAGNOSIS — Z170 Estrogen receptor positive status [ER+]: Secondary | ICD-10-CM

## 2020-12-16 DIAGNOSIS — Z79810 Long term (current) use of selective estrogen receptor modulators (SERMs): Secondary | ICD-10-CM | POA: Diagnosis not present

## 2020-12-16 DIAGNOSIS — Z800 Family history of malignant neoplasm of digestive organs: Secondary | ICD-10-CM | POA: Diagnosis not present

## 2020-12-16 DIAGNOSIS — Z801 Family history of malignant neoplasm of trachea, bronchus and lung: Secondary | ICD-10-CM | POA: Diagnosis not present

## 2020-12-16 DIAGNOSIS — C50311 Malignant neoplasm of lower-inner quadrant of right female breast: Secondary | ICD-10-CM | POA: Diagnosis present

## 2020-12-16 DIAGNOSIS — Z8616 Personal history of COVID-19: Secondary | ICD-10-CM | POA: Insufficient documentation

## 2020-12-16 DIAGNOSIS — M810 Age-related osteoporosis without current pathological fracture: Secondary | ICD-10-CM | POA: Insufficient documentation

## 2020-12-16 DIAGNOSIS — Z90710 Acquired absence of both cervix and uterus: Secondary | ICD-10-CM | POA: Diagnosis not present

## 2020-12-16 DIAGNOSIS — Z923 Personal history of irradiation: Secondary | ICD-10-CM | POA: Insufficient documentation

## 2020-12-16 LAB — CBC WITH DIFFERENTIAL/PLATELET
Abs Immature Granulocytes: 0.02 10*3/uL (ref 0.00–0.07)
Basophils Absolute: 0.1 10*3/uL (ref 0.0–0.1)
Basophils Relative: 1 %
Eosinophils Absolute: 0.3 10*3/uL (ref 0.0–0.5)
Eosinophils Relative: 4 %
HCT: 38.5 % (ref 36.0–46.0)
Hemoglobin: 12.1 g/dL (ref 12.0–15.0)
Immature Granulocytes: 0 %
Lymphocytes Relative: 40 %
Lymphs Abs: 2.9 10*3/uL (ref 0.7–4.0)
MCH: 29.4 pg (ref 26.0–34.0)
MCHC: 31.4 g/dL (ref 30.0–36.0)
MCV: 93.7 fL (ref 80.0–100.0)
Monocytes Absolute: 0.6 10*3/uL (ref 0.1–1.0)
Monocytes Relative: 8 %
Neutro Abs: 3.5 10*3/uL (ref 1.7–7.7)
Neutrophils Relative %: 47 %
Platelets: 180 10*3/uL (ref 150–400)
RBC: 4.11 MIL/uL (ref 3.87–5.11)
RDW: 11.7 % (ref 11.5–15.5)
WBC: 7.3 10*3/uL (ref 4.0–10.5)
nRBC: 0 % (ref 0.0–0.2)

## 2020-12-16 LAB — COMPREHENSIVE METABOLIC PANEL
ALT: 26 U/L (ref 0–44)
AST: 40 U/L (ref 15–41)
Albumin: 3.7 g/dL (ref 3.5–5.0)
Alkaline Phosphatase: 51 U/L (ref 38–126)
Anion gap: 10 (ref 5–15)
BUN: 18 mg/dL (ref 8–23)
CO2: 25 mmol/L (ref 22–32)
Calcium: 9 mg/dL (ref 8.9–10.3)
Chloride: 107 mmol/L (ref 98–111)
Creatinine, Ser: 0.85 mg/dL (ref 0.44–1.00)
GFR, Estimated: >60 mL/min (ref >60)
Glucose, Bld: 149 mg/dL — ABNORMAL HIGH (ref 70–99)
Potassium: 3.6 mmol/L (ref 3.5–5.1)
Sodium: 142 mmol/L (ref 135–145)
Total Bilirubin: 0.4 mg/dL (ref 0.3–1.2)
Total Protein: 6.3 g/dL — ABNORMAL LOW (ref 6.5–8.1)

## 2020-12-16 MED ORDER — TAMOXIFEN CITRATE 20 MG PO TABS: 20.0000 mg | ORAL_TABLET | Freq: Every day | ORAL | 4 refills | Status: DC

## 2020-12-16 NOTE — Progress Notes (Signed)
[place]  [phone] [phone]     ID: Rebekah Wells DOB: 1940-08-18  MR#: [id number]  ENI#:[id number]  Patient Care Team: Lennox Grumbles, MD as PCP - General (Family Medicine) Stark Klein, MD as Consulting Physician (General Surgery) Cheresa Siers, Virgie Dad, MD as Consulting Physician (Oncology) Kyung Rudd, MD as Consulting Physician (Radiation Oncology) Paralee Cancel, MD as Consulting Physician (Orthopedic Surgery) OTHER MD:   CHIEF COMPLAINT: Estrogen receptor positive breast cancer  CURRENT TREATMENT: Tamoxifen   INTERVAL HISTORY: Rebekah Wells returns today for follow-up of her estrogen receptor positive breast cancer accompanied by her husband.    She continues on tamoxifen.  Currently she is having no hot flashes or vaginal wetness or other side effects of this medication.  Since her last visit, she underwent bilateral diagnostic mammography with tomography at [facility] later this week  She also underwent bone density screening with [facility] on 12/26/2019 showing a T-score of -2.5, which is considered osteoporotic. This is stable from prior in 2019.  She has been started on Prolia/denosumab which she receives through her Dr. Gavin Pound office.  So far she has tolerated that with no side effects that she is aware of   REVIEW OF SYSTEMS: Rebekah Wells walks about 30 minutes most days.  She also does some video exercises.  She tells me her grandson was a marine died in an accident recently.  At the funeral she was exposed to [place] and she had a mild case September 2022.  Aside from that a detailed review of systems today was stable   COVID 19 VACCINATION STATUS: Moderna x5, COVID September 2022   HISTORY OF CURRENT ILLNESS: From the original intake note:  Rebekah Wells had routine screening mammography on 10/03/2017 showing a possible abnormality in the right breast. She underwent unilateral right diagnostic mammography with  tomography and right breast ultrasonography at the [facility] in [place], [place] on 10/17/2017 showing: breast density category C. In the right breast lower inner quadrant at 5:30 o' clock and 2 cm from the nipple, there is a hypoechoic mass that measures 0.8 x 0.9 x 0.9 cm. Additionally, at the upper outer quadrant at 10 o'clock and 7 cm from the nipple there is an ill defined area with focal mixed echogenicity. Sonographically, there was one lymph node in the right axilla with a slightly thickened cortex.   Accordingly on 11/20/2017 she proceeded to biopsy of the right breast area in question. The pathology from this procedure showed ([id number]): At 10 o' clock, benign breast tissue with no malignancy identified.  Because there was significant fibrosis in the area, this may be discordant.  At the 5:30 lower inner quadrant, invasive ductal carcinoma grade II, with calcifications. Prognostic indicators significant for: estrogen receptor, 100% positive with strong staining intensity and progesterone receptor, 40% positive with moderate staining intensity. HER 2 is negative by FISH, with a signals ratio of 1.29, and the average number per cell 1.9.  The patient's subsequent history is as detailed below.   PAST MEDICAL HISTORY: Past Medical History:  Diagnosis Date   Arthritis    OA   Asthma    Cancer ([place])    right breast   Dysrhythmia    tachycardia   Gastritis    GERD (gastroesophageal reflux disease)    H/O seasonal allergies    Hyperlipemia    Personal history of radiation therapy     PAST SURGICAL HISTORY: Past Surgical History:  Procedure Laterality Date  ABDOMINAL HYSTERECTOMY     BREAST LUMPECTOMY Right 2019   BREAST LUMPECTOMY WITH RADIOACTIVE SEED AND SENTINEL LYMPH NODE BIOPSY Right 12/06/2017   Procedure: RIGHT BREAST LUMPECTOMY WITH RADIOACTIVE SEED AND SENTINEL LYMPH NODE BIOPSY;  Surgeon: Stark Klein, MD;  Location: [place];  Service: General;  Laterality: Right;    CARPAL TUNNEL RELEASE Left 07/09/2014   Procedure: LEFT CARPAL TUNNEL RELEASE;  Surgeon: Daryll Brod, MD;  Location: [facility] [place];  Service: Orthopedics;  Laterality: Left;   CESAREAN SECTION     CHOLECYSTECTOMY     DG TOES RT FOOT MULTIPLE SPECIF ([place] HX) Right    DIAGNOSTIC LAPAROSCOPY     DIGIT NAIL REMOVAL Right 12/05/2016   Procedure: REMOVAL OF SCREW RIGHT GREAT TOE;  Surgeon: Paralee Cancel, MD;  Location: [facility];  Service: Orthopedics;  Laterality: Right;   EYE SURGERY     bilateral cataract with lens implants   HAND ARTHROPLASTY Right    for OA   KNEE ARTHROSCOPY     x3   NISSEN FUNDOPLICATION     TONSILLECTOMY     TOTAL KNEE ARTHROPLASTY Right 12/05/2016   Procedure: RIGHT TOTAL KNEE ARTHROPLASTY;  Surgeon: Paralee Cancel, MD;  Location: [facility];  Service: Orthopedics;  Laterality: Right;  90 mins   TOTAL KNEE ARTHROPLASTY Left 03/20/2017   Procedure: LEFT TOTAL KNEE ARTHROPLASTY;  Surgeon: Paralee Cancel, MD;  Location: [facility];  Service: Orthopedics;  Laterality: Left;  70 mins  Appendectomy   FAMILY HISTORY Family History  Problem Relation Age of Onset   Colon cancer Mother    CAD Father 44   Lung cancer Sister   The patient's father died at age 31 of CAD. The patient's mother died at age 54 due to colon cancer. The patient had 2 brothers and 2 sisters.  1 of the patient's sisters died of lung cancer ( heavy smoker). The patient's other sister is alive with CLL. There was a maternal grandfather with colon cancer. There was a maternal grandmother with sarcoma of the leg. The patient denies a family history of breast or ovarian cancer.    GYNECOLOGIC HISTORY:  No LMP recorded. Patient has had a hysterectomy. Menarche: 80 years old Age at first live birth: 80 years old She is GXP4.  She is status post patial hysterectomy without BSO. Her LMP was around age 7. She used HRT for 10 years. She used oral contraception for 1 year in 1969.    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 Her husband, Rebekah Wells was a Personal assistant. The patient's daughter Rebekah Wells is a respiratory therapist in [place], [place]. The patient's son, Rebekah Wells lives in [place], [place] and works in Risk analyst and event planning. The patient's daughter, Rebekah Wells lives in [place], [place] and is a Theatre manager. The patient's daughter Rebekah Wells is a rheumatologist in [place].    ADVANCED DIRECTIVES: In the absence of any documentation to the contrary, the patient's spouse is their HCPOA.    HEALTH MAINTENANCE: Social History   Tobacco Use   Smoking status: Never   Smokeless tobacco: Never  Vaping Use   Vaping Use: Never used  Substance Use Topics   Alcohol use: No   Drug use: No     Colonoscopy: in [place] over 7 years ago  PAP: remote  Bone density: 12/2020 T score -2.5   Allergies  Allergen Reactions   Penicillins Rash and Other (See Comments)    PATIENT HAS HAD A PCN REACTION  WITH IMMEDIATE RASH, FACIAL/TONGUE/THROAT SWELLING, SOB, OR LIGHTHEADEDNESS WITH HYPOTENSION:  #  #  YES  #  #  HAS PT DEVELOPED SEVERE RASH INVOLVING MUCUS MEMBRANES or SKIN NECROSIS: #  #  YES  #  #  Has patient had a PCN reaction that required hospitalization: ALREADY HOSPITALIZED Has patient had a PCN reaction occurring within the last 10 years: No    Other Rash    Steri Strips/Surgi glue (Dermatitis) Patient developed contact dermatitis with dermabond following knee surgery and, possibly, with steri strips following her breast core biopsy   Silver Rash    Likely due to Sulfa component   Sulfa Antibiotics Rash    Current Outpatient Medications  Medication Sig Dispense Refill   albuterol (PROVENTIL HFA;VENTOLIN HFA) 108 (90 BASE) MCG/ACT inhaler Inhale 1-2 puffs into the lungs every 6 (six) hours as needed for wheezing or shortness of breath.      beta carotene w/minerals (OCUVITE) tablet Take 1 tablet by mouth 4 (four) times a week. At night.     Calcium Carb-Cholecalciferol  (CALCIUM 600+D3 PO) Take 1 tablet by mouth at bedtime.     cetirizine (ZYRTEC) 10 MG tablet Take 10 mg by mouth at bedtime as needed for allergies.      meloxicam (MOBIC) 15 MG tablet Take 15 mg by mouth daily as needed for pain.     metoprolol succinate (TOPROL-XL) 25 MG 24 hr tablet Take 25 mg by mouth at bedtime.      metroNIDAZOLE (METROGEL) 1 % gel Apply topically daily. 45 g 0   montelukast (SINGULAIR) 5 MG chewable tablet Chew 1 tablet (5 mg total) by mouth at bedtime.     omeprazole (PRILOSEC) 20 MG capsule Take 20 mg by mouth daily as needed (for acid reflex).      simvastatin (ZOCOR) 40 MG tablet Take 40 mg by mouth every evening.      tamoxifen (NOLVADEX) 20 MG tablet Take 1 tablet (20 mg total) by mouth daily. 90 tablet 0   traZODone (DESYREL) 50 MG tablet Take 25 mg by mouth at bedtime as needed for sleep.     No current facility-administered medications for this visit.    OBJECTIVE: White woman who appears younger than stated age  43:   12/16/20 1407  BP: (!) 101/52  Pulse: 88  Resp: 18  Temp: 98.4 F (36.9 C)  SpO2: 96%      Body mass index is 25.23 kg/m.   Wt Readings from Last 3 Encounters:  12/16/20 142 lb 6.4 oz (64.6 kg)  11/13/19 147 lb 12.8 oz (67 kg)  05/12/19 150 lb 9.6 oz (68.3 kg)      ECOG FS:1 - Symptomatic but completely ambulatory  Sclerae unicteric, EOMs intact Wearing a mask No cervical or supraclavicular adenopathy Lungs no rales or rhonchi Heart regular rate and rhythm Abd soft, nontender, positive bowel sounds MSK no focal spinal tenderness, no upper extremity lymphedema Neuro: nonfocal, well oriented, appropriate affect Breasts: The right breast has undergone lumpectomy followed by radiation.  There is no evidence of local recurrence.  The left breast is benign.  Both axillae are benign.   LAB RESULTS:  CMP     Component Value Date/Time   NA 138 11/13/2019 1352   K 3.9 11/13/2019 1352   CL 105 11/13/2019 1352   CO2 27  11/13/2019 1352   GLUCOSE 109 (H) 11/13/2019 1352   BUN 12 11/13/2019 1352   CREATININE 0.69 11/13/2019 1352   CREATININE 0.87  05/12/2019 1449   CALCIUM 9.0 11/13/2019 1352   PROT 6.5 11/13/2019 1352   ALBUMIN 3.4 (L) 11/13/2019 1352   AST 50 (H) 11/13/2019 1352   AST 56 (H) 05/12/2019 1449   ALT 35 11/13/2019 1352   ALT 28 05/12/2019 1449   ALKPHOS 59 11/13/2019 1352   BILITOT 0.3 11/13/2019 1352   BILITOT 0.3 05/12/2019 1449   GFRNONAA >60 11/13/2019 1352   GFRNONAA >60 05/12/2019 1449   GFRAA >60 11/13/2019 1352   GFRAA >60 05/12/2019 1449    No results found for: TOTALPROTELP, ALBUMINELP, A1GS, A2GS, BETS, BETA2SER, GAMS, MSPIKE, SPEI  No results found for: KPAFRELGTCHN, LAMBDASER, KAPLAMBRATIO  Lab Results  Component Value Date   WBC 7.3 12/16/2020   NEUTROABS 3.5 12/16/2020   HGB 12.1 12/16/2020   HCT 38.5 12/16/2020   MCV 93.7 12/16/2020   PLT 180 12/16/2020   No results found for: LABCA2  No components found for: KCMKLK917  No results for input(s): INR in the last 168 hours.  No results found for: LABCA2  No results found for: HXT056  No results found for: PVX480  No results found for: XKP537  No results found for: CA2729  No components found for: HGQUANT  No results found for: CEA1 / No results found for: CEA1   No results found for: AFPTUMOR  No results found for: CHROMOGRNA  No results found for: HGBA, HGBA2QUANT, HGBFQUANT, HGBSQUAN (Hemoglobinopathy evaluation)   No results found for: LDH  No results found for: IRON, TIBC, IRONPCTSAT (Iron and TIBC)  No results found for: FERRITIN  Urinalysis No results found for: COLORURINE, APPEARANCEUR, LABSPEC, PHURINE, GLUCOSEU, HGBUR, BILIRUBINUR, KETONESUR, PROTEINUR, UROBILINOGEN, NITRITE, LEUKOCYTESUR   STUDIES: No results found.   ELIGIBLE FOR AVAILABLE RESEARCH PROTOCOL: no   ASSESSMENT: 80 y.o. [place], [place] woman status post right breast biopsy x2 on 10/24/2017 for a clinical T1b  NX, stage IA invasive ductal carcinoma, grade 2, estrogen and progesterone receptor positive, HER-2 not amplified  (a) biopsy of a second site in the same breast showed fibrosis, discordant  (1) status post right lumpectomy and sentinel lymph node sampling 12/06/2017 for a pT1c pN0(i), stage IA invasive ductal carcinoma, with negative margins.  (a) a total of 5 axillary lymph nodes were removed  (2) Oncotype score of 19 predicts a risk of recurrence outside the breast in the next 9 years of 6% if her only systemic therapy is an antiestrogen for 5 years; it also predicts no significant benefit from chemotherapy.  (4) adjuvant radiation completed 01/21/2018 - 02/15/2018  Site/dose: The right breast was initially treated to 42.56 Gy in 16 fractions, followed by an 8 Gy boost delivered in 4 fractions, to yield a final total dose of 50.56 Gy.  (5) tamoxifen started 12/21/2017  (a) status post prior hysterectomy without salpingo-oophorectomy  (b) bone density obtained [facility] showed a T score of -2.5 at the left femoral neck--denosumab/Prolia started April 2022 at Dr. Berna Bue office)   PLAN: Rebekah Wells is now 3 years out from definitive surgery for her breast cancer with no evidence of disease recurrence.  This is very favorable.  She is tolerating tamoxifen well and the plan is to continue that a total of 5 years.  We discussed her Prolia, which she receives through her daughter Berna Bue office.  In addition to helping with osteoporosis this has been shown to reduce breast cancer risk.  Reeval will see [place] again in 1 year.  She knows to call for any other  issue that may develop before then  Total encounter time 20 minutes.*  Kaden Dunkel, Virgie Dad, MD  12/16/20 2:26 PM Medical Oncology and Hematology [facility] [place],  [place] [address] Tel. [phone]    Fax. [phone]   I, Wilburn Mylar, am acting as scribe for Dr. Virgie Dad. Rebekah Wells.  I,  Lurline Del MD, have reviewed the above documentation for accuracy and completeness, and I agree with the above.   *Total Encounter Time as defined by the Centers for Medicare and Medicaid Services includes, in addition to the face-to-face time of a patient visit (documented in the note above) non-face-to-face time: obtaining and reviewing outside history, ordering and reviewing medications, tests or procedures, care coordination (communications with other health care professionals or caregivers) and documentation in the medical record.

## 2020-12-17 ENCOUNTER — Other Ambulatory Visit: Payer: Self-pay | Admitting: Oncology

## 2020-12-17 ENCOUNTER — Ambulatory Visit
Admission: RE | Admit: 2020-12-17 | Discharge: 2020-12-17 | Disposition: A | Discharge status: Home / Self Care | Payer: Medicare Other | Source: Ambulatory Visit | Attending: Oncology | Admitting: Oncology

## 2020-12-17 DIAGNOSIS — Z1231 Encounter for screening mammogram for malignant neoplasm of breast: Secondary | ICD-10-CM

## 2020-12-17 DIAGNOSIS — Z9889 Other specified postprocedural states: Secondary | ICD-10-CM

## 2020-12-17 DIAGNOSIS — Z853 Personal history of malignant neoplasm of breast: Secondary | ICD-10-CM

## 2020-12-24 ENCOUNTER — Other Ambulatory Visit: Payer: Self-pay | Admitting: Oncology

## 2020-12-27 ENCOUNTER — Other Ambulatory Visit: Payer: Self-pay | Admitting: Oncology

## 2020-12-30 ENCOUNTER — Other Ambulatory Visit: Payer: Self-pay | Admitting: Oncology

## 2021-01-10 ENCOUNTER — Telehealth: Payer: Self-pay

## 2021-01-10 NOTE — Telephone Encounter (Signed)
3 Attempts at returning pt phone call, phone rings and cuts off so I am not able to leave VM's.  Pt tamoxifen was sent to [facility] on 10/27.

## 2021-01-17 ENCOUNTER — Telehealth: Payer: Self-pay

## 2021-01-17 ENCOUNTER — Other Ambulatory Visit: Payer: Self-pay | Admitting: Oncology

## 2021-01-17 ENCOUNTER — Other Ambulatory Visit: Payer: Self-pay

## 2021-01-17 MED ORDER — TAMOXIFEN CITRATE 20 MG PO TABS: 20.0000 mg | ORAL_TABLET | Freq: Every day | ORAL | 4 refills | Status: DC

## 2021-01-17 NOTE — Telephone Encounter (Signed)
Called pt and spoke with her husband, informed him that pt medication was sent to [facility], per her request.  Husband verbalized thanks and understanding.

## 2021-08-12 LAB — EXTERNAL GENERIC LAB PROCEDURE: COLOGUARD: NEGATIVE

## 2021-11-10 ENCOUNTER — Other Ambulatory Visit: Payer: Self-pay | Admitting: Hematology and Oncology

## 2021-11-10 DIAGNOSIS — Z1231 Encounter for screening mammogram for malignant neoplasm of breast: Secondary | ICD-10-CM

## 2021-12-11 IMAGING — MG MM DIGITAL SCREENING BILAT W/ TOMO AND CAD
8 series · 9 of 24 positions shown · non-contrast
Comparison: Previous exam(s).

CLINICAL DATA: Screening.

EXAM:
DIGITAL SCREENING BILATERAL MAMMOGRAM WITH TOMOSYNTHESIS AND CAD
TECHNIQUE: Bilateral screening digital craniocaudal and mediolateral oblique
mammograms were obtained. Bilateral screening digital breast
tomosynthesis was performed. The images were evaluated with
computer-aided detection.

[R MLO synth-2D]
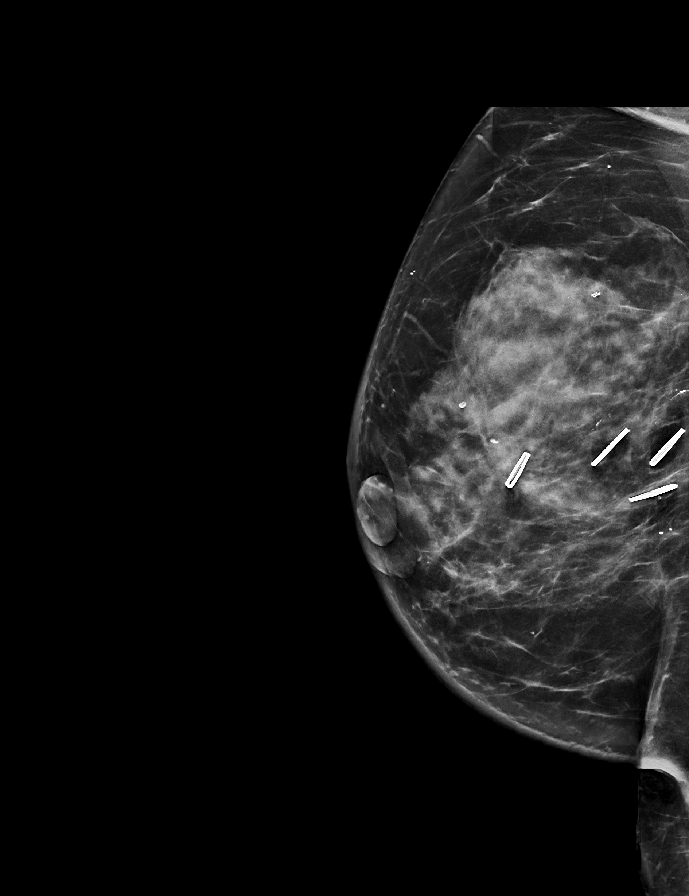

[R CC synth-2D]
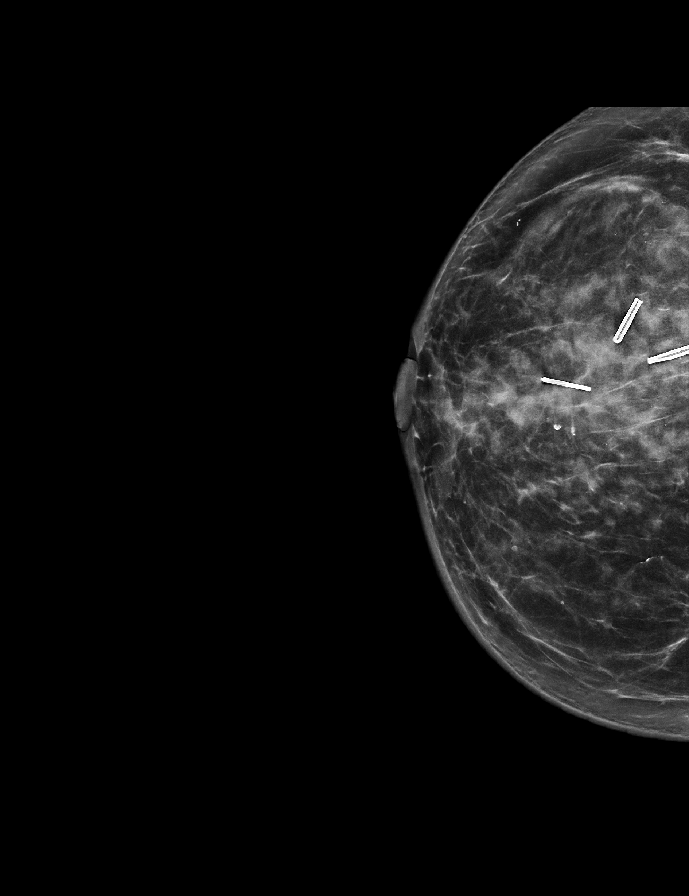

[L MLO synth-2D]
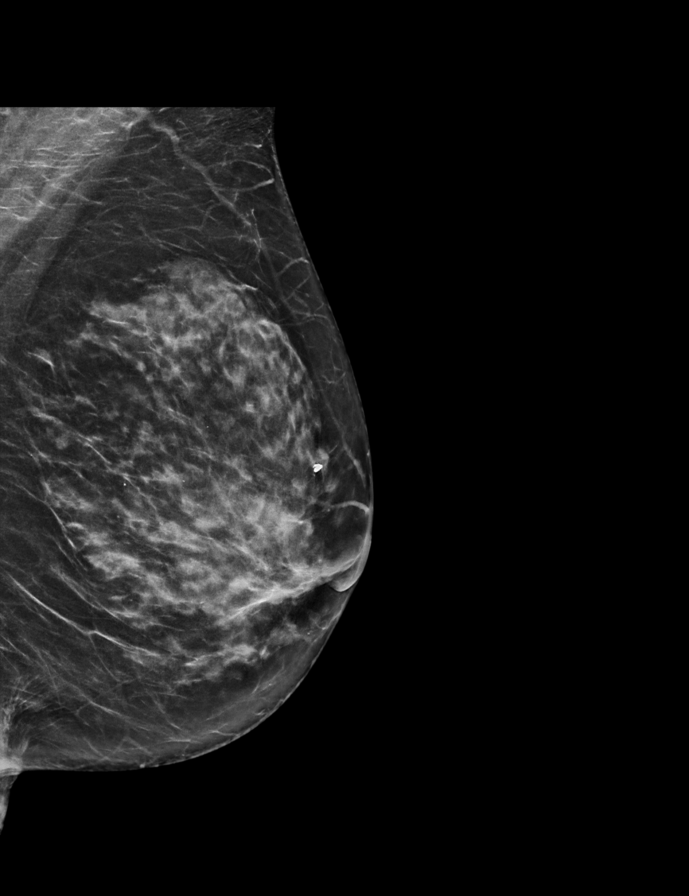

[L CC synth-2D]
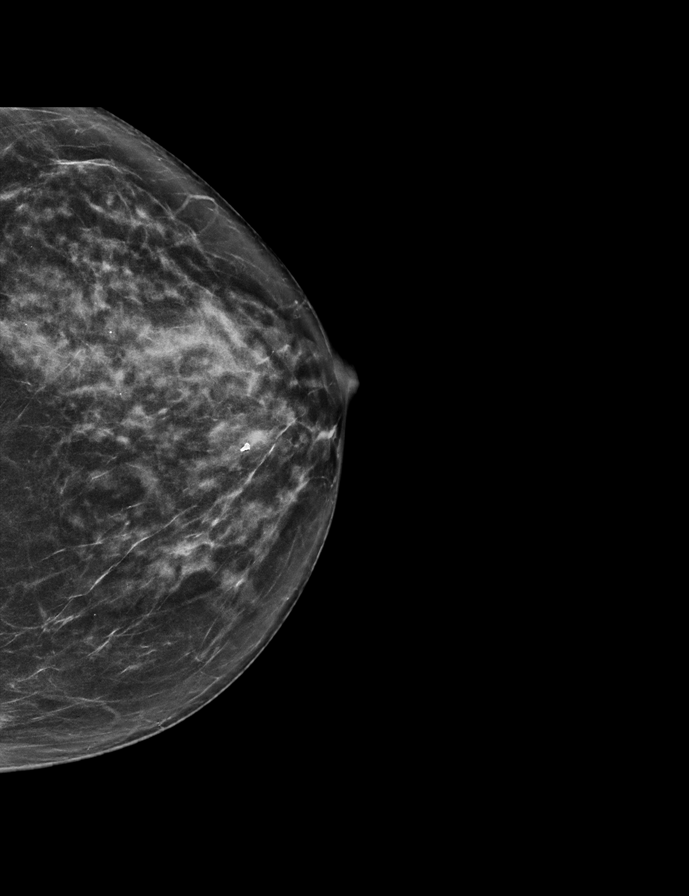

[L CC tomo · 2 of 60 frames shown]
[frame 20/60]
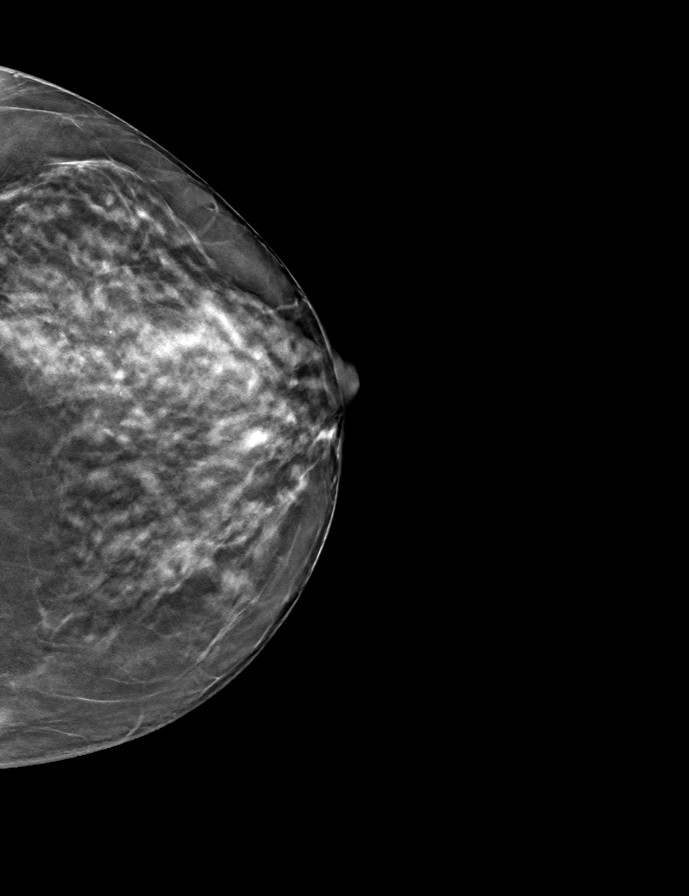
[frame 31/60]
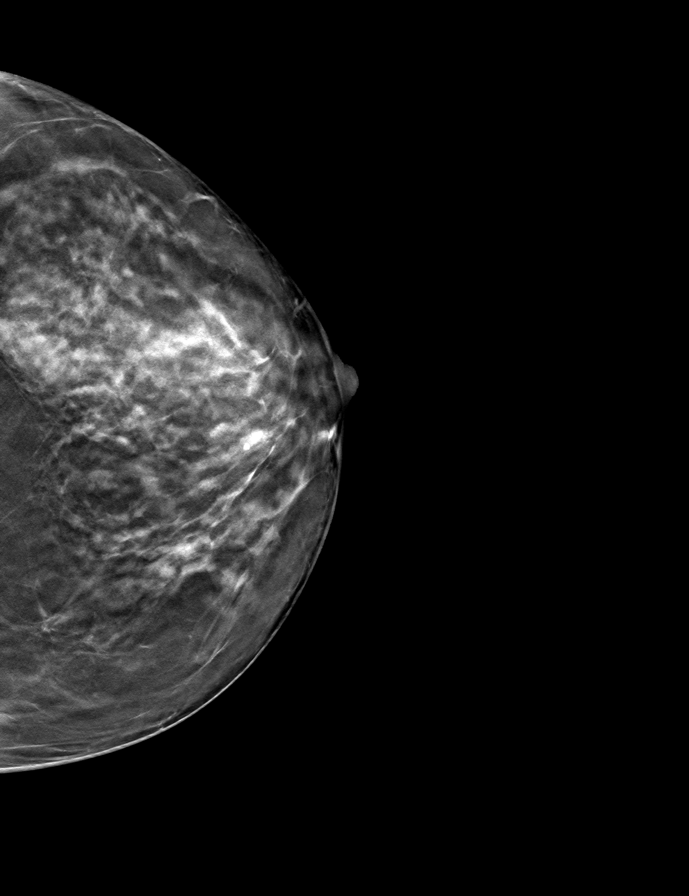

[R CC tomo · tomo slice 33/65.0]
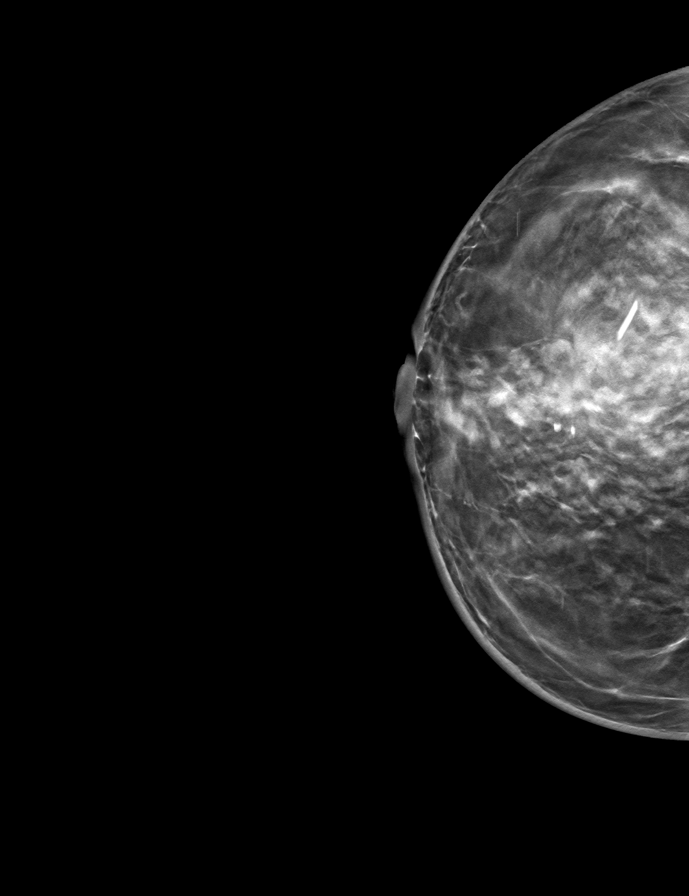

[L MLO tomo · tomo slice 33/64.0]
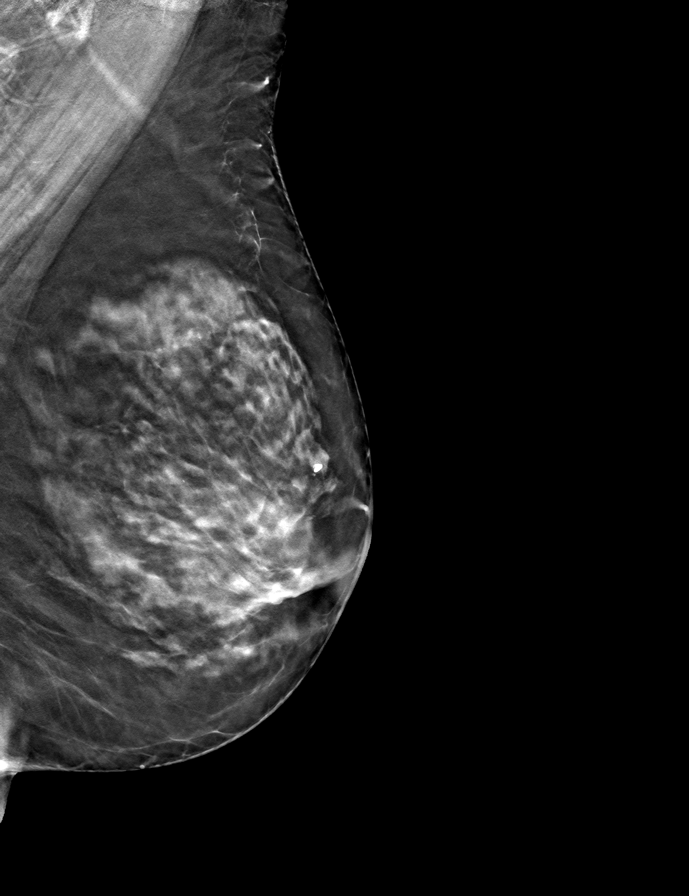

[R MLO tomo · tomo slice 36/71.0]
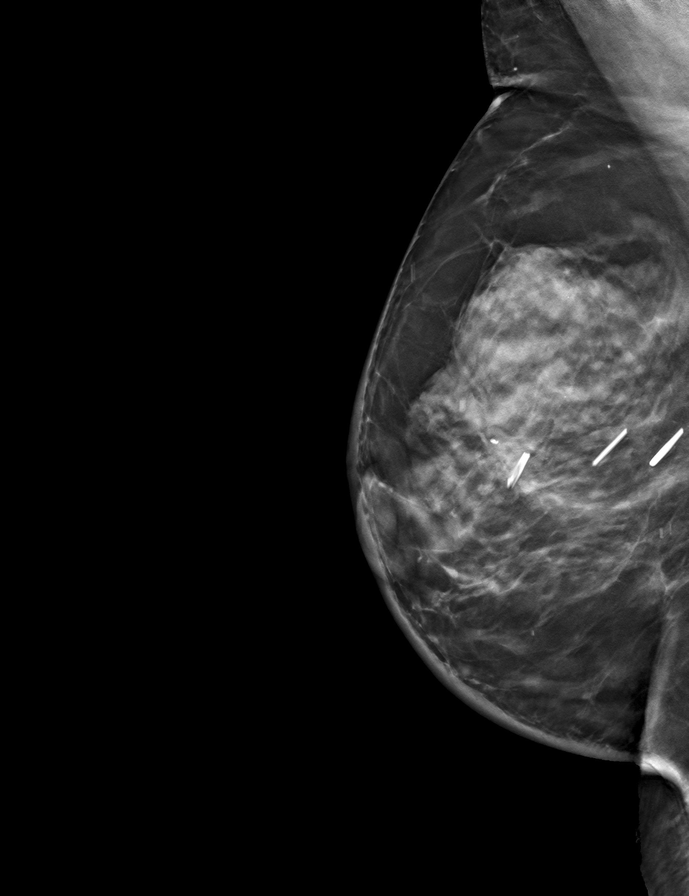

[9 of 24 positions shown; findings below may reference images not displayed]

ACR Breast Density Category c: The breast tissue is heterogeneously
dense, which may obscure small masses.
FINDINGS: There are no findings suspicious for malignancy.
IMPRESSION: No mammographic evidence of malignancy. A result letter of this
screening mammogram will be mailed directly to the patient.

RECOMMENDATION:
Screening mammogram in one year. (Code:Q3-W-BC3)

BI-RADS CATEGORY  1: Negative.

## 2021-12-19 ENCOUNTER — Other Ambulatory Visit: Payer: Medicare Other

## 2021-12-19 ENCOUNTER — Ambulatory Visit: Payer: Medicare Other | Admitting: Hematology and Oncology

## 2021-12-29 ENCOUNTER — Other Ambulatory Visit: Payer: Self-pay | Admitting: *Deleted

## 2021-12-29 DIAGNOSIS — C50311 Malignant neoplasm of lower-inner quadrant of right female breast: Secondary | ICD-10-CM

## 2021-12-29 NOTE — Progress Notes (Signed)
Patient Care Team: Lennox Grumbles, MD as PCP - General (Family Medicine) Stark Klein, MD as Consulting Physician (General Surgery) Magrinat, Virgie Dad, MD (Inactive) as Consulting Physician (Oncology) Kyung Rudd, MD as Consulting Physician (Radiation Oncology) Paralee Cancel, MD as Consulting Physician (Orthopedic Surgery)  DIAGNOSIS:  Encounter Diagnosis  Name Primary?   Malignant neoplasm of lower-inner quadrant of right breast of female, estrogen receptor positive ([place]) Yes    SUMMARY OF ONCOLOGIC HISTORY: Oncology History  Malignant neoplasm of lower-inner quadrant of right breast of female, estrogen receptor positive ([place])  10/24/2017 Initial Diagnosis   status post right breast biopsy x2 on 10/24/2017 for a clinical T1b NX, stage IA invasive ductal carcinoma, grade 2, estrogen and progesterone receptor positive, HER-2 not amplified             (a) biopsy of a second site in the same breast showed fibrosis, discordant   12/06/2017 Surgery   status post right lumpectomy and sentinel lymph node sampling for a pT1c pN0, stage IA invasive ductal carcinoma, with negative margins.             (a) a total of 5 axillary lymph nodes were removed   12/06/2017 Oncotype testing   Oncotype score of 19 predicts a risk of recurrence outside the breast in the next 9 years of 6% if her only systemic therapy is an antiestrogen for 5 years; it also predicts no significant benefit from chemotherapy.   01/02/2018 Cancer Staging   Staging form: Breast, AJCC 8th Edition - Pathologic: Stage IA (pT1c, pN0, cM0, G2, ER+, PR+, HER2-, Oncotype DX score: 19) - Signed by Gardenia Phlegm, NP on 01/02/2018   12/2017 -  Anti-estrogen oral therapy    tamoxifen started 12/21/2017             (a) status post prior hysterectomy without salpingo-oophorectomy   01/21/2018 - 02/15/2018 Radiation Therapy   Site/dose: The right breast was initially treated to 42.56 Gy in 16 fractions, followed by an 8  Gy boost delivered in 4 fractions, to yield a final total dose of 50.56 Gy.       CHIEF COMPLIANT: Follow-up breast cancer surveillance on tamoxifen  INTERVAL HISTORY: Rebekah Wells is a 81 y.o with the above-mentioned breast cancer surveillance completed radiation and currently on antiestrogen therapy with tamoxifen. She presents to the clinic for a follow-up and Establish oncology care with Dr. Lindi Adie. She reports that she is tolerating the tamoxifen extremely well. She has some mild muscle cramps in legs. Denies pain but some discomfort in the breast feels achy.     ALLERGIES:  is allergic to penicillins, other, silver, and sulfa antibiotics.  MEDICATIONS:  Current Outpatient Medications  Medication Sig Dispense Refill   albuterol (PROVENTIL HFA;VENTOLIN HFA) 108 (90 BASE) MCG/ACT inhaler Inhale 1-2 puffs into the lungs every 6 (six) hours as needed for wheezing or shortness of breath.      beta carotene w/minerals (OCUVITE) tablet Take 1 tablet by mouth 4 (four) times a week. At night.     Calcium Carb-Cholecalciferol (CALCIUM 600+D3 PO) Take 1 tablet by mouth at bedtime.     cetirizine (ZYRTEC) 10 MG tablet Take 10 mg by mouth at bedtime as needed for allergies.      denosumab (PROLIA) 60 MG/ML SOSY injection Inject 60 mg into the skin. 1 mL 0   meloxicam (MOBIC) 15 MG tablet Take 15 mg by mouth daily as needed for pain.     metoprolol succinate (TOPROL-XL)  25 MG 24 hr tablet Take 25 mg by mouth at bedtime.      metroNIDAZOLE (METROGEL) 1 % gel Apply topically daily. 45 g 0   montelukast (SINGULAIR) 5 MG chewable tablet Chew 1 tablet (5 mg total) by mouth at bedtime.     omeprazole (PRILOSEC) 20 MG capsule Take 20 mg by mouth daily as needed (for acid reflex).      simvastatin (ZOCOR) 40 MG tablet Take 40 mg by mouth every evening.      traZODone (DESYREL) 50 MG tablet Take 25 mg by mouth at bedtime as needed for sleep.     Zoster Vaccine Adjuvanted [facility]) injection Inject  0.5 mLs into the muscle once.     tamoxifen (NOLVADEX) 20 MG tablet Take 1 tablet (20 mg total) by mouth daily. 90 tablet 4   No current facility-administered medications for this visit.    PHYSICAL EXAMINATION: ECOG PERFORMANCE STATUS: 1 - Symptomatic but completely ambulatory  Vitals:   12/30/21 0821  BP: (!) 108/54  Pulse: 82  Resp: 18  Temp: (!) 97.4 F (36.3 C)  SpO2: 95%   Filed Weights   12/30/21 0821  Weight: 145 lb 12.8 oz (66.1 kg)    BREAST: No palpable masses or nodules in either right or left breasts. No palpable axillary supraclavicular or infraclavicular adenopathy no breast tenderness or nipple discharge. (exam performed in the presence of a chaperone)  LABORATORY DATA:  I have reviewed the data as listed    Latest Ref Rng & Units 12/30/2021    7:59 AM 12/16/2020    1:49 PM 11/13/2019    1:52 PM  CMP  Glucose 70 - 99 mg/dL 144  149  109   BUN 8 - 23 mg/dL _0 Creatinine 0.44 - 1.00 mg/dL 0.77  0.85  0.69   Sodium 135 - 145 mmol/L 142  142  138   Potassium 3.5 - 5.1 mmol/L 4.1  3.6  3.9   Chloride 98 - 111 mmol/L 107  107  105   CO2 22 - 32 mmol/L _1 Calcium 8.9 - 10.3 mg/dL 8.9  9.0  9.0   Total Protein 6.5 - 8.1 g/dL 7.0  6.3  6.5   Total Bilirubin 0.3 - 1.2 mg/dL 0.6  0.4  0.3   Alkaline Phos 38 - 126 U/L 65  51  59   AST 15 - 41 U/L 68  40  50   ALT 0 - 44 U/L 32  26  35     Lab Results  Component Value Date   WBC 7.7 12/30/2021   HGB 12.5 12/30/2021   HCT 38.8 12/30/2021   MCV 92.8 12/30/2021   PLT 183 12/30/2021   NEUTROABS 3.9 12/30/2021    ASSESSMENT & PLAN:  Malignant neoplasm of lower-inner quadrant of right breast of female, estrogen receptor positive ([place]) Dr. Virgie Dad patient to establish oncology care with me 10/24/2017: T1BNX stage Ia grade 2 IDC ER/PR positive HER2 negative 12/06/2017: Right lumpectomy: T1CN0 stage Ia IDC negative margins 0/5 lymph nodes, Oncotype: 19 (6% ROR) 02/15/2018: Completed  radiation  Current treatment: Tamoxifen started 12/21/2017 (status post hysterectomy without salpingo-oophorectomy): 5-year plan of treatment  Tamoxifen toxicities: Occasional muscle cramps but otherwise tolerating it extremely well.  Breast cancer surveillance: Breast exam 12/30/2021: Benign Mammogram 12/30/2021 performed.  Results pending  Osteoporosis: Currently on Prolia started April 2022 at Dr. Gavin Pound office She lives in [place]  which is 3 hours away from [place]. After next year's visit she will be followed by her primary care physician get her mammograms closer to home. Return to clinic in 1 year for follow-up    No orders of the defined types were placed in this encounter.  The patient has a good understanding of the overall plan. she agrees with it. she will call with any problems that may develop before the next visit here. Total time spent: 30 mins including face to face time and time spent for planning, charting and co-ordination of care   Harriette Ohara, MD 12/30/21    I Gardiner Coins am scribing for Dr. Lindi Adie  I have reviewed the above documentation for accuracy and completeness, and I agree with the above.

## 2021-12-30 ENCOUNTER — Inpatient Hospital Stay (HOSPITAL_BASED_OUTPATIENT_CLINIC_OR_DEPARTMENT_OTHER): Payer: Medicare Other | Admitting: Hematology and Oncology

## 2021-12-30 ENCOUNTER — Ambulatory Visit
Admission: RE | Admit: 2021-12-30 | Discharge: 2021-12-30 | Disposition: A | Discharge status: Home / Self Care | Payer: Medicare Other | Source: Ambulatory Visit | Attending: Hematology and Oncology | Admitting: Hematology and Oncology

## 2021-12-30 ENCOUNTER — Other Ambulatory Visit: Payer: Self-pay

## 2021-12-30 ENCOUNTER — Inpatient Hospital Stay: Payer: Medicare Other | Attending: Hematology and Oncology

## 2021-12-30 VITALS — BP 108/54 | HR 82 | Temp 97.4°F | Resp 18 | Ht 63.0 in | Wt 145.8 lb

## 2021-12-30 DIAGNOSIS — Z79810 Long term (current) use of selective estrogen receptor modulators (SERMs): Secondary | ICD-10-CM | POA: Diagnosis not present

## 2021-12-30 DIAGNOSIS — C50311 Malignant neoplasm of lower-inner quadrant of right female breast: Secondary | ICD-10-CM | POA: Insufficient documentation

## 2021-12-30 DIAGNOSIS — Z170 Estrogen receptor positive status [ER+]: Secondary | ICD-10-CM | POA: Diagnosis not present

## 2021-12-30 DIAGNOSIS — R252 Cramp and spasm: Secondary | ICD-10-CM | POA: Diagnosis not present

## 2021-12-30 DIAGNOSIS — Z1231 Encounter for screening mammogram for malignant neoplasm of breast: Secondary | ICD-10-CM

## 2021-12-30 DIAGNOSIS — Z90710 Acquired absence of both cervix and uterus: Secondary | ICD-10-CM | POA: Diagnosis not present

## 2021-12-30 DIAGNOSIS — Z923 Personal history of irradiation: Secondary | ICD-10-CM | POA: Diagnosis not present

## 2021-12-30 DIAGNOSIS — M810 Age-related osteoporosis without current pathological fracture: Secondary | ICD-10-CM | POA: Diagnosis not present

## 2021-12-30 LAB — CBC WITH DIFFERENTIAL (CANCER CENTER ONLY)
Abs Immature Granulocytes: 0.02 10*3/uL (ref 0.00–0.07)
Basophils Absolute: 0.1 10*3/uL (ref 0.0–0.1)
Basophils Relative: 1 %
Eosinophils Absolute: 0.3 10*3/uL (ref 0.0–0.5)
Eosinophils Relative: 4 %
HCT: 38.8 % (ref 36.0–46.0)
Hemoglobin: 12.5 g/dL (ref 12.0–15.0)
Immature Granulocytes: 0 %
Lymphocytes Relative: 37 %
Lymphs Abs: 2.8 10*3/uL (ref 0.7–4.0)
MCH: 29.9 pg (ref 26.0–34.0)
MCHC: 32.2 g/dL (ref 30.0–36.0)
MCV: 92.8 fL (ref 80.0–100.0)
Monocytes Absolute: 0.6 10*3/uL (ref 0.1–1.0)
Monocytes Relative: 8 %
Neutro Abs: 3.9 10*3/uL (ref 1.7–7.7)
Neutrophils Relative %: 50 %
Platelet Count: 183 10*3/uL (ref 150–400)
RBC: 4.18 MIL/uL (ref 3.87–5.11)
RDW: 12.1 % (ref 11.5–15.5)
WBC Count: 7.7 10*3/uL (ref 4.0–10.5)
nRBC: 0 % (ref 0.0–0.2)

## 2021-12-30 LAB — CMP (CANCER CENTER ONLY)
ALT: 32 U/L (ref 0–44)
AST: 68 U/L — ABNORMAL HIGH (ref 15–41)
Albumin: 3.9 g/dL (ref 3.5–5.0)
Alkaline Phosphatase: 65 U/L (ref 38–126)
Anion gap: 9 (ref 5–15)
BUN: 14 mg/dL (ref 8–23)
CO2: 26 mmol/L (ref 22–32)
Calcium: 8.9 mg/dL (ref 8.9–10.3)
Chloride: 107 mmol/L (ref 98–111)
Creatinine: 0.77 mg/dL (ref 0.44–1.00)
GFR, Estimated: >60 mL/min (ref >60)
Glucose, Bld: 144 mg/dL — ABNORMAL HIGH (ref 70–99)
Potassium: 4.1 mmol/L (ref 3.5–5.1)
Sodium: 142 mmol/L (ref 135–145)
Total Bilirubin: 0.6 mg/dL (ref 0.3–1.2)
Total Protein: 7 g/dL (ref 6.5–8.1)

## 2021-12-30 MED ORDER — TAMOXIFEN CITRATE 20 MG PO TABS: 20.0000 mg | ORAL_TABLET | Freq: Every day | ORAL | 4 refills | Status: DC

## 2021-12-30 NOTE — Assessment & Plan Note (Addendum)
Dr. Virgie Dad patient to establish oncology care with me 10/24/2017: T1BNX stage Ia grade 2 IDC ER/PR positive HER2 negative 12/06/2017: Right lumpectomy: T1CN0 stage Ia IDC negative margins 0/5 lymph nodes, Oncotype: 19 (6% ROR) 02/15/2018: Completed radiation  Current treatment: Tamoxifen started 12/21/2017 (status post hysterectomy without salpingo-oophorectomy): 5-year plan of treatment  Tamoxifen toxicities: Occasional muscle cramps but otherwise tolerating it extremely well.  Breast cancer surveillance: Breast exam 12/30/2021: Benign Mammogram 12/30/2021 performed.  Results pending  Osteoporosis: Currently on Prolia started April 2022 at Dr. Gavin Pound office Return to clinic in 1 year for follow-up

## 2022-01-06 ENCOUNTER — Ambulatory Visit: Payer: Medicare Other | Admitting: Hematology and Oncology

## 2022-01-06 ENCOUNTER — Other Ambulatory Visit: Payer: Medicare Other

## 2023-01-05 ENCOUNTER — Ambulatory Visit: Payer: Medicare Other | Admitting: Hematology and Oncology

## 2023-01-05 ENCOUNTER — Inpatient Hospital Stay: Payer: Medicare Other | Attending: Hematology and Oncology | Admitting: Hematology and Oncology

## 2023-01-05 VITALS — BP 107/67 | HR 94 | Temp 98.3°F | Resp 18 | Ht 63.0 in | Wt 139.5 lb

## 2023-01-05 DIAGNOSIS — E119 Type 2 diabetes mellitus without complications: Secondary | ICD-10-CM | POA: Insufficient documentation

## 2023-01-05 DIAGNOSIS — Z170 Estrogen receptor positive status [ER+]: Secondary | ICD-10-CM | POA: Diagnosis not present

## 2023-01-05 DIAGNOSIS — M810 Age-related osteoporosis without current pathological fracture: Secondary | ICD-10-CM | POA: Insufficient documentation

## 2023-01-05 DIAGNOSIS — Z79810 Long term (current) use of selective estrogen receptor modulators (SERMs): Secondary | ICD-10-CM | POA: Diagnosis not present

## 2023-01-05 DIAGNOSIS — Z7984 Long term (current) use of oral hypoglycemic drugs: Secondary | ICD-10-CM | POA: Insufficient documentation

## 2023-01-05 DIAGNOSIS — C50311 Malignant neoplasm of lower-inner quadrant of right female breast: Secondary | ICD-10-CM | POA: Diagnosis present

## 2023-01-05 MED ORDER — METFORMIN HCL 850 MG PO TABS: 850.0000 mg | ORAL_TABLET | Freq: Every day | ORAL | Status: AC

## 2023-01-05 NOTE — Progress Notes (Signed)
Patient Care Team: Lenor Derrick, MD as PCP - General (Family Medicine) Almond Lint, MD as Consulting Physician (General Surgery) Magrinat, Valentino Hue, MD (Inactive) as Consulting Physician (Oncology) Dorothy Puffer, MD as Consulting Physician (Radiation Oncology) Durene Romans, MD as Consulting Physician (Orthopedic Surgery)  DIAGNOSIS:  Encounter Diagnosis  Name Primary?   Malignant neoplasm of lower-inner quadrant of right breast of female, estrogen receptor positive (HCC) Yes    SUMMARY OF ONCOLOGIC HISTORY: Oncology History  Malignant neoplasm of lower-inner quadrant of right breast of female, estrogen receptor positive (HCC)  10/24/2017 Initial Diagnosis   status post right breast biopsy x2 on 10/24/2017 for a clinical T1b NX, stage IA invasive ductal carcinoma, grade 2, estrogen and progesterone receptor positive, HER-2 not amplified             (a) biopsy of a second site in the same breast showed fibrosis, discordant   12/06/2017 Surgery   status post right lumpectomy and sentinel lymph node sampling for a pT1c pN0, stage IA invasive ductal carcinoma, with negative margins.             (a) a total of 5 axillary lymph nodes were removed   12/06/2017 Oncotype testing   Oncotype score of 19 predicts a risk of recurrence outside the breast in the next 9 years of 6% if her only systemic therapy is an antiestrogen for 5 years; it also predicts no significant benefit from chemotherapy.   01/02/2018 Cancer Staging   Staging form: Breast, AJCC 8th Edition - Pathologic: Stage IA (pT1c, pN0, cM0, G2, ER+, PR+, HER2-, Oncotype DX score: 19) - Signed by Loa Socks, NP on 01/02/2018   12/2017 -  Anti-estrogen oral therapy    tamoxifen started 12/21/2017             (a) status post prior hysterectomy without salpingo-oophorectomy   01/21/2018 - 02/15/2018 Radiation Therapy   Site/dose: The right breast was initially treated to 42.56 Gy in 16 fractions, followed by an 8  Gy boost delivered in 4 fractions, to yield a final total dose of 50.56 Gy.       CHIEF COMPLIANT: Follow-up on tamoxifen therapy  HISTORY OF PRESENT ILLNESS:   History of Present Illness   Miss Freking, a patient with a history of breast cancer, presents for her final follow-up visit after completing five years of tamoxifen therapy. She reports no adverse effects from the medication and is eager to discontinue it. She has recently been diagnosed with diabetes and has started metformin 850mg  once daily. She has been successful in managing her diabetes, reducing her A1c from 8.5 to 6.4 in three months. She has also lost some weight. She has no other new symptoms or health concerns.        ALLERGIES:  is allergic to penicillins, other, silver, and sulfa antibiotics.  MEDICATIONS:  Current Outpatient Medications  Medication Sig Dispense Refill   metFORMIN (GLUCOPHAGE) 850 MG tablet Take 1 tablet (850 mg total) by mouth daily with breakfast.     albuterol (PROVENTIL HFA;VENTOLIN HFA) 108 (90 BASE) MCG/ACT inhaler Inhale 1-2 puffs into the lungs every 6 (six) hours as needed for wheezing or shortness of breath.      beta carotene w/minerals (OCUVITE) tablet Take 1 tablet by mouth 4 (four) times a week. At night.     Calcium Carb-Cholecalciferol (CALCIUM 600+D3 PO) Take 1 tablet by mouth at bedtime.     cetirizine (ZYRTEC) 10 MG tablet Take 10 mg by mouth  at bedtime as needed for allergies.      denosumab (PROLIA) 60 MG/ML SOSY injection Inject 60 mg into the skin. 1 mL 0   meloxicam (MOBIC) 15 MG tablet Take 15 mg by mouth daily as needed for pain.     metoprolol succinate (TOPROL-XL) 25 MG 24 hr tablet Take 25 mg by mouth at bedtime.      metroNIDAZOLE (METROGEL) 1 % gel Apply topically daily. 45 g 0   montelukast (SINGULAIR) 5 MG chewable tablet Chew 1 tablet (5 mg total) by mouth at bedtime.     omeprazole (PRILOSEC) 20 MG capsule Take 20 mg by mouth daily as needed (for acid reflex).       simvastatin (ZOCOR) 40 MG tablet Take 40 mg by mouth every evening.      traZODone (DESYREL) 50 MG tablet Take 25 mg by mouth at bedtime as needed for sleep.     Zoster Vaccine Adjuvanted [facility]) injection Inject 0.5 mLs into the muscle once.     No current facility-administered medications for this visit.    PHYSICAL EXAMINATION: ECOG PERFORMANCE STATUS: 1 - Symptomatic but completely ambulatory  Vitals:   01/05/23 1141  BP: 107/67  Pulse: 94  Resp: 18  Temp: 98.3 F (36.8 C)  SpO2: 98%   Filed Weights   01/05/23 1141  Weight: 139 lb 8 oz (63.3 kg)      LABORATORY DATA:  I have reviewed the data as listed    Latest Ref Rng & Units 12/30/2021    7:59 AM 12/16/2020    1:49 PM 11/13/2019    1:52 PM  CMP  Glucose 70 - 99 mg/dL 213  086  578   BUN 8 - 23 mg/dL 14  18  12    Creatinine 0.44 - 1.00 mg/dL 4.69  6.29  5.28   Sodium 135 - 145 mmol/L 142  142  138   Potassium 3.5 - 5.1 mmol/L 4.1  3.6  3.9   Chloride 98 - 111 mmol/L 107  107  105   CO2 22 - 32 mmol/L 26  25  27    Calcium 8.9 - 10.3 mg/dL 8.9  9.0  9.0   Total Protein 6.5 - 8.1 g/dL 7.0  6.3  6.5   Total Bilirubin 0.3 - 1.2 mg/dL 0.6  0.4  0.3   Alkaline Phos 38 - 126 U/L 65  51  59   AST 15 - 41 U/L 68  40  50   ALT 0 - 44 U/L 32  26  35     Lab Results  Component Value Date   WBC 7.7 12/30/2021   HGB 12.5 12/30/2021   HCT 38.8 12/30/2021   MCV 92.8 12/30/2021   PLT 183 12/30/2021   NEUTROABS 3.9 12/30/2021    ASSESSMENT & PLAN:  Malignant neoplasm of lower-inner quadrant of right breast of female, estrogen receptor positive (HCC) Dr. Darrall Dears patient to establish oncology care with me 10/24/2017: T1BNX stage Ia grade 2 IDC ER/PR positive HER2 negative 12/06/2017: Right lumpectomy: T1CN0 stage Ia IDC negative margins 0/5 lymph nodes, Oncotype: 19 (6% ROR) 02/15/2018: Completed radiation   Current treatment: Tamoxifen started 12/21/2017 (status post hysterectomy without salpingo-oophorectomy):  5-year plan of treatment   Tamoxifen toxicities: Occasional muscle cramps but otherwise tolerating it extremely well. Since patient completed 5 years of tamoxifen we did recommended discontinuing it at this time.  She is very happy to hear that.   Breast cancer surveillance: Mammogram 12/30/2021: Benign breast density  category C   Osteoporosis: Currently on Prolia started April 2022 at Dr. Zenovia Jordan office She lives in [facility] which is [place] hours away from [place].  From next year and she will get mammograms with her primary care physician  Breast Cancer   Completed 5 years of adjuvant therapy with Tamoxifen. No evidence of recurrence.   -Discontinue Tamoxifen.    Type 2 Diabetes Mellitus   Newly diagnosed. On Metformin 850mg  daily. Recent improvement in HbA1c from 8.5 to 6.4.   -Continue Metformin 850mg  daily.   -Encouraged continued lifestyle modifications to maintain glycemic control.     Return to clinic on an as-needed basis  No orders of the defined types were placed in this encounter.  The patient has a good understanding of the overall plan. she agrees with it. she will call with any problems that may develop before the next visit here. Total time spent: 30 mins including face to face time and time spent for planning, charting and co-ordination of care   Tamsen Meek, MD 01/05/23

## 2023-01-05 NOTE — Assessment & Plan Note (Addendum)
Dr. Darrall Dears patient to establish oncology care with me 10/24/2017: T1BNX stage Ia grade 2 IDC ER/PR positive HER2 negative 12/06/2017: Right lumpectomy: T1CN0 stage Ia IDC negative margins 0/5 lymph nodes, Oncotype: 19 (6% ROR) 02/15/2018: Completed radiation   Current treatment: Tamoxifen started 12/21/2017 (status post hysterectomy without salpingo-oophorectomy): 5-year plan of treatment   Tamoxifen toxicities: Occasional muscle cramps but otherwise tolerating it extremely well. Since patient completed 5 years of tamoxifen we did recommended discontinuing it at this time.  She is very happy to hear that.   Breast cancer surveillance: Mammogram 12/30/2021: Benign breast density category C   Osteoporosis: Currently on Prolia started April 2022 at Dr. Zenovia Jordan office She lives in [facility] which is [place] away from [place].  From next year and she will get mammograms with her primary care physician  Return to clinic on an as-needed basis
